# Patient Record
Sex: Female | Born: 2000 | Hispanic: No | Marital: Single | Smoking: Never smoker
Health system: Southern US, Community
[De-identification: ages and names within clinical notes are randomized; demographics above are authoritative.]

## PROBLEM LIST (undated history)

## (undated) DIAGNOSIS — F419 Anxiety disorder, unspecified: Secondary | ICD-10-CM

## (undated) DIAGNOSIS — J45909 Unspecified asthma, uncomplicated: Secondary | ICD-10-CM

## (undated) DIAGNOSIS — N946 Dysmenorrhea, unspecified: Secondary | ICD-10-CM

## (undated) DIAGNOSIS — R040 Epistaxis: Secondary | ICD-10-CM

## (undated) DIAGNOSIS — J309 Allergic rhinitis, unspecified: Secondary | ICD-10-CM

## (undated) DIAGNOSIS — F32A Depression, unspecified: Secondary | ICD-10-CM

## (undated) DIAGNOSIS — L709 Acne, unspecified: Secondary | ICD-10-CM

## (undated) HISTORY — DX: Acne, unspecified: L70.9

## (undated) HISTORY — PX: NO PAST SURGERIES: SHX2092

## (undated) HISTORY — DX: Dysmenorrhea, unspecified: N94.6

## (undated) HISTORY — DX: Epistaxis: R04.0

## (undated) HISTORY — DX: Anxiety disorder, unspecified: F41.9

## (undated) HISTORY — DX: Allergic rhinitis, unspecified: J30.9

## (undated) HISTORY — DX: Depression, unspecified: F32.A

## (undated) NOTE — *Deleted (*Deleted)
I value your feedback and entrusting us with your care. If you get a [facility] patient survey, I would appreciate you taking the time to let us know about your experience today. Thank you!  As of November 27, 2019, your lab results will be released to your MyChart immediately, before I even have a chance to see them. Please give me time to review them and contact you if there are any abnormalities. Thank you for your patience.  

---
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
## 2020-08-02 ENCOUNTER — Ambulatory Visit: Payer: Self-pay | Admitting: Obstetrics and Gynecology

## 2020-08-02 NOTE — Progress Notes (Deleted)
PCP:  Patient, No Pcp Per   No chief complaint on file.    HPI:      Danielle Hammond is a 19 y.o. G0P0000 whose LMP was No LMP recorded. (Menstrual status: Irregular Periods)., presents today for her annual examination.  Her menses are {norm/abn:715}, lasting {number:22536} days.  Dysmenorrhea {dysmen:716}. She {does:18564} have intermenstrual bleeding.  Sex activity: {sex active:315163}.  Last Pap: {PYPP:509326712}  Results were: {norm/abn:16707::"no abnormalities"} /neg HPV DNA *** Hx of STDs: {STD hx:14358}  Last mammogram: {date:304500300}  Results were: {norm/abn:13465} There is no FH of breast cancer. There is no FH of ovarian cancer. The patient {does:18564} do self-breast exams.  Tobacco use: {tob:20664} Alcohol use: {Alcohol:11675} No drug use.  Exercise: {exercise:31265}  She {does:18564} get adequate calcium and Vitamin D in her diet.    The pregnancy intention screening data noted above was reviewed. Potential methods of contraception were discussed. The patient elected to proceed with {Upstream End Methods:24109}.    Past Medical History:  Diagnosis Date  . Acne   . Allergic rhinitis   . Dysmenorrhea in adolescent   . Epistaxis     No past surgical history on file.  Family History  Problem Relation Age of Onset  . Breast cancer Neg Hx   . Ovarian cancer Neg Hx     Social History   Socioeconomic History  . Marital status: Single    Spouse name: Not on file  . Number of children: Not on file  . Years of education: Not on file  . Highest education level: Not on file  Occupational History  . Not on file  Tobacco Use  . Smoking status: Never Smoker  . Smokeless tobacco: Never Used  Vaping Use  . Vaping Use: Never used  Substance and Sexual Activity  . Alcohol use: Never  . Drug use: Never  . Sexual activity: Not Currently    Birth control/protection: None  Other Topics Concern  . Not on file  Social History Narrative  . Not on file     Social Determinants of Health   Financial Resource Strain:   . Difficulty of Paying Living Expenses:   Food Insecurity:   . Worried About Programme researcher, broadcasting/film/video in the Last Year:   . Barista in the Last Year:   Transportation Needs:   . Freight forwarder (Medical):   Marland Kitchen Lack of Transportation (Non-Medical):   Physical Activity:   . Days of Exercise per Week:   . Minutes of Exercise per Session:   Stress:   . Feeling of Stress :   Social Connections:   . Frequency of Communication with Friends and Family:   . Frequency of Social Gatherings with Friends and Family:   . Attends Religious Services:   . Active Member of Clubs or Organizations:   . Attends Banker Meetings:   Marland Kitchen Marital Status:   Intimate Partner Violence:   . Fear of Current or Ex-Partner:   . Emotionally Abused:   Marland Kitchen Physically Abused:   . Sexually Abused:      Current Outpatient Medications:  .  albuterol (PROVENTIL HFA;VENTOLIN HFA) 108 (90 BASE) MCG/ACT inhaler, Inhale 2 puffs into the lungs every 6 (six) hours as needed. For wheezing, Disp: , Rfl:  .  albuterol (PROVENTIL HFA;VENTOLIN HFA) 108 (90 BASE) MCG/ACT inhaler, Inhale 2 puffs into the lungs every 6 (six) hours as needed for wheezing or shortness of breath., Disp: 1 Inhaler, Rfl: 0 .  cetirizine (ZYRTEC) 10 MG tablet, Take 10 mg by mouth daily., Disp: , Rfl:  .  fluticasone (FLONASE) 50 MCG/ACT nasal spray, Place 1 spray into both nostrils daily., Disp: 16 g, Rfl: 0 .  montelukast (SINGULAIR) 5 MG chewable tablet, Chew 5 mg by mouth daily., Disp: , Rfl:  .  Norethindrone Acetate-Ethinyl Estrad-FE (MICROGESTIN 24 FE) 1-20 MG-MCG(24) tablet, Take 1 tablet by mouth daily., Disp: 84 tablet, Rfl: 3     ROS:  Review of Systems BREAST: No symptoms   Objective: There were no vitals taken for this visit.   OBGyn Exam  Results: No results found for this or any previous visit (from the past 24  hour(s)).  Assessment/Plan: No diagnosis found.  No orders of the defined types were placed in this encounter.            GYN counsel {counseling:16159}     F/U  No follow-ups on file.  Sailor Haughn B. Almeda Ezra, PA-C 08/02/2020 11:31 AM

## 2020-08-19 ENCOUNTER — Encounter: Payer: Medicaid Other | Admitting: Obstetrics and Gynecology

## 2020-08-25 ENCOUNTER — Encounter: Payer: Medicaid Other | Admitting: Obstetrics and Gynecology

## 2020-08-25 NOTE — Progress Notes (Deleted)
Patient, No Pcp Per   No chief complaint on file.   HPI:      [name] is a 19 y.o. G0P0000 whose LMP was No LMP recorded. (Menstrual status: Irregular Periods)., presents today for ***  Neg STD testing 11/19   Past Medical History:  Diagnosis Date  . Acne   . Allergic rhinitis   . Dysmenorrhea in adolescent   . Epistaxis     No past surgical history on file.  Family History  Problem Relation Age of Onset  . Breast cancer Neg Hx   . Ovarian cancer Neg Hx     Social History   Socioeconomic History  . Marital status: Single    Spouse name: Not on file  . Number of children: Not on file  . Years of education: Not on file  . Highest education level: Not on file  Occupational History  . Not on file  Tobacco Use  . Smoking status: Never Smoker  . Smokeless tobacco: Never Used  Vaping Use  . Vaping Use: Never used  Substance and Sexual Activity  . Alcohol use: Never  . Drug use: Never  . Sexual activity: Not Currently    Birth control/protection: None  Other Topics Concern  . Not on file  Social History Narrative  . Not on file   Social Determinants of Health   Financial Resource Strain:   . Difficulty of Paying Living Expenses: Not on file  Food Insecurity:   . Worried About Programme researcher, broadcasting/film/video in the Last Year: Not on file  . Ran Out of Food in the Last Year: Not on file  Transportation Needs:   . Lack of Transportation (Medical): Not on file  . Lack of Transportation (Non-Medical): Not on file  Physical Activity:   . Days of Exercise per Week: Not on file  . Minutes of Exercise per Session: Not on file  Stress:   . Feeling of Stress : Not on file  Social Connections:   . Frequency of Communication with Friends and Family: Not on file  . Frequency of Social Gatherings with Friends and Family: Not on file  . Attends Religious Services: Not on file  . Active Member of Clubs or Organizations: Not on file  . Attends Banker  Meetings: Not on file  . Marital Status: Not on file  Intimate Partner Violence:   . Fear of Current or Ex-Partner: Not on file  . Emotionally Abused: Not on file  . Physically Abused: Not on file  . Sexually Abused: Not on file    Outpatient Medications Prior to Visit  Medication Sig Dispense Refill  . albuterol (PROVENTIL HFA;VENTOLIN HFA) 108 (90 BASE) MCG/ACT inhaler Inhale 2 puffs into the lungs every 6 (six) hours as needed. For wheezing    . albuterol (PROVENTIL HFA;VENTOLIN HFA) 108 (90 BASE) MCG/ACT inhaler Inhale 2 puffs into the lungs every 6 (six) hours as needed for wheezing or shortness of breath. 1 Inhaler 0  . cetirizine (ZYRTEC) 10 MG tablet Take 10 mg by mouth daily.    . fluticasone (FLONASE) 50 MCG/ACT nasal spray Place 1 spray into both nostrils daily. 16 g 0  . montelukast (SINGULAIR) 5 MG chewable tablet Chew 5 mg by mouth daily.    . Norethindrone Acetate-Ethinyl Estrad-FE (MICROGESTIN 24 FE) 1-20 MG-MCG(24) tablet Take 1 tablet by mouth daily. 84 tablet 3   No facility-administered medications prior to visit.      ROS:  Review of Systems BREAST: No symptoms   OBJECTIVE:   Vitals:  There were no vitals taken for this visit.  Physical Exam  Results: No results found for this or any previous visit (from the past 24 hour(s)).   Assessment/Plan: No diagnosis found.    No orders of the defined types were placed in this encounter.     No follow-ups on file.  Harutyun Monteverde B. Aveleen Nevers, PA-C 08/25/2020 10:10 AM

## 2020-09-06 ENCOUNTER — Encounter: Payer: Self-pay | Admitting: Obstetrics and Gynecology

## 2020-09-06 ENCOUNTER — Ambulatory Visit (INDEPENDENT_AMBULATORY_CARE_PROVIDER_SITE_OTHER): Payer: Medicaid Other | Admitting: Obstetrics and Gynecology

## 2020-09-06 ENCOUNTER — Other Ambulatory Visit (HOSPITAL_COMMUNITY)
Admission: RE | Admit: 2020-09-06 | Discharge: 2020-09-06 | Disposition: A | Discharge status: Home / Self Care | Payer: Medicaid Other | Source: Ambulatory Visit | Attending: Obstetrics and Gynecology | Admitting: Obstetrics and Gynecology

## 2020-09-06 ENCOUNTER — Other Ambulatory Visit: Payer: Self-pay

## 2020-09-06 VITALS — BP 90/60 | Ht 64.0 in | Wt 133.0 lb

## 2020-09-06 DIAGNOSIS — A630 Anogenital (venereal) warts: Secondary | ICD-10-CM

## 2020-09-06 DIAGNOSIS — Z113 Encounter for screening for infections with a predominantly sexual mode of transmission: Secondary | ICD-10-CM | POA: Diagnosis present

## 2020-09-06 NOTE — Progress Notes (Signed)
Patient, No Pcp Per   Chief Complaint  Patient presents with  . STD testing    HPI:      [name] is a 19 y.o. G0P0000 whose LMP was Patient's last menstrual period was 08/11/2020 (approximate)., presents today for STD testing. No vag d/c, known exposures, but has noticed some bumps vaginally recently. She has not been sex active in a couple months and now has female partners.  Stopped OCPs about 2 yrs ago. Doing fine without them.    Past Medical History:  Diagnosis Date  . Acne   . Allergic rhinitis   . Dysmenorrhea in adolescent   . Epistaxis     History reviewed. No pertinent surgical history.  Family History  Problem Relation Age of Onset  . Breast cancer Neg Hx   . Ovarian cancer Neg Hx     Social History   Socioeconomic History  . Marital status: Single    Spouse name: Not on file  . Number of children: Not on file  . Years of education: Not on file  . Highest education level: Not on file  Occupational History  . Not on file  Tobacco Use  . Smoking status: Never Smoker  . Smokeless tobacco: Never Used  Vaping Use  . Vaping Use: Never used  Substance and Sexual Activity  . Alcohol use: Never  . Drug use: Never  . Sexual activity: Yes    Birth control/protection: None  Other Topics Concern  . Not on file  Social History Narrative  . Not on file   Social Determinants of Health   Financial Resource Strain:   . Difficulty of Paying Living Expenses: Not on file  Food Insecurity:   . Worried About Programme researcher, broadcasting/film/video in the Last Year: Not on file  . Ran Out of Food in the Last Year: Not on file  Transportation Needs:   . Lack of Transportation (Medical): Not on file  . Lack of Transportation (Non-Medical): Not on file  Physical Activity:   . Days of Exercise per Week: Not on file  . Minutes of Exercise per Session: Not on file  Stress:   . Feeling of Stress : Not on file  Social Connections:   . Frequency of Communication with  Friends and Family: Not on file  . Frequency of Social Gatherings with Friends and Family: Not on file  . Attends Religious Services: Not on file  . Active Member of Clubs or Organizations: Not on file  . Attends Banker Meetings: Not on file  . Marital Status: Not on file  Intimate Partner Violence:   . Fear of Current or Ex-Partner: Not on file  . Emotionally Abused: Not on file  . Physically Abused: Not on file  . Sexually Abused: Not on file    Outpatient Medications Prior to Visit  Medication Sig Dispense Refill  . fluticasone (FLONASE) 50 MCG/ACT nasal spray Place 1 spray into both nostrils daily. 16 g 0  . Norethindrone Acetate-Ethinyl Estrad-FE (MICROGESTIN 24 FE) 1-20 MG-MCG(24) tablet Take 1 tablet by mouth daily. (Patient not taking: Reported on 09/06/2020) 84 tablet 3  . albuterol (PROVENTIL HFA;VENTOLIN HFA) 108 (90 BASE) MCG/ACT inhaler Inhale 2 puffs into the lungs every 6 (six) hours as needed. For wheezing    . albuterol (PROVENTIL HFA;VENTOLIN HFA) 108 (90 BASE) MCG/ACT inhaler Inhale 2 puffs into the lungs every 6 (six) hours as needed for wheezing or shortness of breath. 1 Inhaler 0  .  cetirizine (ZYRTEC) 10 MG tablet Take 10 mg by mouth daily.    . montelukast (SINGULAIR) 5 MG chewable tablet Chew 5 mg by mouth daily.     No facility-administered medications prior to visit.      ROS:  Review of Systems  Constitutional: Negative for fever.  Gastrointestinal: Negative for blood in stool, constipation, diarrhea, nausea and vomiting.  Genitourinary: Negative for dyspareunia, dysuria, flank pain, frequency, hematuria, urgency, vaginal bleeding, vaginal discharge and vaginal pain.  Musculoskeletal: Negative for back pain.  Skin: Negative for rash.    OBJECTIVE:   Vitals:  BP 90/60   Ht 5\' 4"  (1.626 m)   Wt 133 lb (60.3 kg)   LMP 08/11/2020 (Approximate)   BMI 22.83 kg/m   Physical Exam Vitals reviewed.  Constitutional:      Appearance: She  is well-developed.  Pulmonary:     Effort: Pulmonary effort is normal.  Genitourinary:    Labia:        Right: No rash, tenderness or lesion.        Left: Lesion present. No rash or tenderness.     Musculoskeletal:        General: Normal range of motion.     Cervical back: Normal range of motion.  Skin:    General: Skin is warm and dry.  Neurological:     General: No focal deficit present.     Mental Status: She is alert and oriented to person, place, and time.     Cranial Nerves: No cranial nerve deficit.  Psychiatric:        Mood and Affect: Mood normal.        Behavior: Behavior normal.        Thought Content: Thought content normal.        Judgment: Judgment normal.     Assessment/Plan: Screening for STD (sexually transmitted disease) - Plan: Cervicovaginal ancillary only; will f/u with results  Genital warts--several ext lesions, treated with TCA. Wash in 4-6 hrs. F/u in 4-6 wks prn sx.     Return if symptoms worsen or fail to improve.  Gabreil [place]onkers B. Adael Culbreath, PA-C 09/06/2020 4:47 PM

## 2020-09-06 NOTE — Patient Instructions (Signed)
I value your feedback and entrusting us with your care. If you get a [facility] patient survey, I would appreciate you taking the time to let us know about your experience today. Thank you!  As of November 27, 2019, your lab results will be released to your MyChart immediately, before I even have a chance to see them. Please give me time to review them and contact you if there are any abnormalities. Thank you for your patience.  

## 2020-09-10 LAB — CERVICOVAGINAL ANCILLARY ONLY
Chlamydia: NEGATIVE
Comment: NEGATIVE
Comment: NEGATIVE
Comment: NORMAL
Neisseria Gonorrhea: NEGATIVE
Trichomonas: NEGATIVE

## 2020-10-21 ENCOUNTER — Ambulatory Visit: Payer: Medicaid Other | Admitting: Obstetrics and Gynecology

## 2020-10-21 NOTE — Progress Notes (Deleted)
    Patient, No Pcp Per   No chief complaint on file.   HPI:      Ms. Analilia Geddis is a 19 y.o. G0P0000 whose LMP was No LMP recorded., presents today for ***  Treated 9/21 with podophyllin for ext warts  Past Medical History:  Diagnosis Date  . Acne   . Allergic rhinitis   . Dysmenorrhea in adolescent   . Epistaxis     No past surgical history on file.  Family History  Problem Relation Age of Onset  . Breast cancer Neg Hx   . Ovarian cancer Neg Hx     Social History   Socioeconomic History  . Marital status: Single    Spouse name: Not on file  . Number of children: Not on file  . Years of education: Not on file  . Highest education level: Not on file  Occupational History  . Not on file  Tobacco Use  . Smoking status: Never Smoker  . Smokeless tobacco: Never Used  Vaping Use  . Vaping Use: Never used  Substance and Sexual Activity  . Alcohol use: Never  . Drug use: Never  . Sexual activity: Yes    Birth control/protection: None  Other Topics Concern  . Not on file  Social History Narrative  . Not on file   Social Determinants of Health   Financial Resource Strain:   . Difficulty of Paying Living Expenses: Not on file  Food Insecurity:   . Worried About Programme researcher, broadcasting/film/video in the Last Year: Not on file  . Ran Out of Food in the Last Year: Not on file  Transportation Needs:   . Lack of Transportation (Medical): Not on file  . Lack of Transportation (Non-Medical): Not on file  Physical Activity:   . Days of Exercise per Week: Not on file  . Minutes of Exercise per Session: Not on file  Stress:   . Feeling of Stress : Not on file  Social Connections:   . Frequency of Communication with Friends and Family: Not on file  . Frequency of Social Gatherings with Friends and Family: Not on file  . Attends Religious Services: Not on file  . Active Member of Clubs or Organizations: Not on file  . Attends Banker Meetings: Not on file  .  Marital Status: Not on file  Intimate Partner Violence:   . Fear of Current or Ex-Partner: Not on file  . Emotionally Abused: Not on file  . Physically Abused: Not on file  . Sexually Abused: Not on file    Outpatient Medications Prior to Visit  Medication Sig Dispense Refill  . fluticasone (FLONASE) 50 MCG/ACT nasal spray Place 1 spray into both nostrils daily. 16 g 0  . Norethindrone Acetate-Ethinyl Estrad-FE (MICROGESTIN 24 FE) 1-20 MG-MCG(24) tablet Take 1 tablet by mouth daily. (Patient not taking: Reported on 09/06/2020) 84 tablet 3   No facility-administered medications prior to visit.      ROS:  Review of Systems BREAST: No symptoms   OBJECTIVE:   Vitals:  There were no vitals taken for this visit.  Physical Exam  Results: No results found for this or any previous visit (from the past 24 hour(s)).   Assessment/Plan: No diagnosis found.    No orders of the defined types were placed in this encounter.     No follow-ups on file.  Nickalus Thornsberry B. Issai Werling, PA-C 10/21/2020 11:46 AM

## 2020-10-28 ENCOUNTER — Ambulatory Visit: Payer: Medicaid Other | Admitting: Obstetrics and Gynecology

## 2020-11-30 ENCOUNTER — Ambulatory Visit (INDEPENDENT_AMBULATORY_CARE_PROVIDER_SITE_OTHER): Payer: Medicaid Other | Admitting: Obstetrics and Gynecology

## 2020-11-30 ENCOUNTER — Other Ambulatory Visit: Payer: Self-pay

## 2020-11-30 ENCOUNTER — Encounter: Payer: Self-pay | Admitting: Obstetrics and Gynecology

## 2020-11-30 ENCOUNTER — Other Ambulatory Visit (HOSPITAL_COMMUNITY)
Admission: RE | Admit: 2020-11-30 | Discharge: 2020-11-30 | Disposition: A | Discharge status: Home / Self Care | Payer: Medicaid Other | Source: Ambulatory Visit | Attending: Obstetrics and Gynecology | Admitting: Obstetrics and Gynecology

## 2020-11-30 VITALS — BP 100/70 | Ht 65.0 in | Wt 128.0 lb

## 2020-11-30 DIAGNOSIS — A630 Anogenital (venereal) warts: Secondary | ICD-10-CM | POA: Diagnosis not present

## 2020-11-30 DIAGNOSIS — N760 Acute vaginitis: Secondary | ICD-10-CM

## 2020-11-30 DIAGNOSIS — Z3009 Encounter for other general counseling and advice on contraception: Secondary | ICD-10-CM

## 2020-11-30 DIAGNOSIS — Z113 Encounter for screening for infections with a predominantly sexual mode of transmission: Secondary | ICD-10-CM

## 2020-11-30 DIAGNOSIS — B9689 Other specified bacterial agents as the cause of diseases classified elsewhere: Secondary | ICD-10-CM | POA: Diagnosis not present

## 2020-11-30 LAB — POCT WET PREP WITH KOH
Clue Cells Wet Prep HPF POC: POSITIVE
KOH Prep POC: POSITIVE — AB
Trichomonas, UA: NEGATIVE
Yeast Wet Prep HPF POC: NEGATIVE

## 2020-11-30 MED ORDER — METRONIDAZOLE 500 MG PO TABS: 500.0000 mg | ORAL_TABLET | Freq: Two times a day (BID) | ORAL | 0 refills | Status: AC

## 2020-11-30 NOTE — Progress Notes (Signed)
Patient, No Pcp Per   Chief Complaint  Patient presents with  . STD testing    HPI:      Ms. Wilma Michaelson is a 19 y.o. G0P0000 whose LMP was Patient's last menstrual period was 11/02/2020 (approximate)., presents today for increased vag d/c with odor, no irritation. Sx off and on since 10/21 but increased past 2 wks. No hx of BV. Has new female sex partner since neg 9/21 STD testing. Using condoms sometimes.  Treated for ext vag warts 9/21 with TCA with mostly relief. Has 2 small remaining lesions and would like retreated.  Menses monthly. Had clots with mid cycle bleeding 11/21, concerned about SAB. Wasn't late for period, no UPT done. Used to be on OCPs, declines BC for now. Sometimes using condoms.    Past Medical History:  Diagnosis Date  . Acne   . Allergic rhinitis   . Dysmenorrhea in adolescent   . Epistaxis     History reviewed. No pertinent surgical history.  Family History  Problem Relation Age of Onset  . Breast cancer Neg Hx   . Ovarian cancer Neg Hx     Social History   Socioeconomic History  . Marital status: Single    Spouse name: Not on file  . Number of children: Not on file  . Years of education: Not on file  . Highest education level: Not on file  Occupational History  . Not on file  Tobacco Use  . Smoking status: Never Smoker  . Smokeless tobacco: Never Used  Vaping Use  . Vaping Use: Never used  Substance and Sexual Activity  . Alcohol use: Never  . Drug use: Never  . Sexual activity: Yes    Birth control/protection: None  Other Topics Concern  . Not on file  Social History Narrative  . Not on file   Social Determinants of Health   Financial Resource Strain: Not on file  Food Insecurity: Not on file  Transportation Needs: Not on file  Physical Activity: Not on file  Stress: Not on file  Social Connections: Not on file  Intimate Partner Violence: Not on file    Outpatient Medications Prior to Visit  Medication Sig Dispense  Refill  . fluticasone (FLONASE) 50 MCG/ACT nasal spray Place 1 spray into both nostrils daily. (Patient not taking: Reported on 11/30/2020) 16 g 0  . Norethindrone Acetate-Ethinyl Estrad-FE (MICROGESTIN 24 FE) 1-20 MG-MCG(24) tablet Take 1 tablet by mouth daily. (Patient not taking: No sig reported) 84 tablet 3   No facility-administered medications prior to visit.      ROS:  Review of Systems  Constitutional: Negative for fever, malaise/fatigue and weight loss.  Gastrointestinal: Negative for blood in stool, constipation, diarrhea, nausea and vomiting.  Genitourinary: Positive for vaginal discharge. Negative for dyspareunia, dysuria, flank pain, frequency, hematuria, urgency, vaginal bleeding and vaginal pain.  Musculoskeletal: Negative for back pain.  Skin: Negative for itching and rash.    OBJECTIVE:   Vitals:  BP 100/70   Ht 5\' 5"  (1.651 m)   Wt 128 lb (58.1 kg)   LMP 11/02/2020 (Approximate)   BMI 21.30 kg/m   Physical Exam Vitals reviewed.  Constitutional:      Appearance: She is well-developed.  Pulmonary:     Effort: Pulmonary effort is normal.  Genitourinary:    Pubic Area: No rash.      Labia:        Right: Lesion present. No rash or tenderness.  Left: No rash, tenderness or lesion.      Vagina: Vaginal discharge present. No erythema or tenderness.     Cervix: Normal.     Uterus: Normal. Not enlarged and not tender.      Adnexa: Right adnexa normal and left adnexa normal.       Right: No mass or tenderness.         Left: No mass or tenderness.         Comments: 2 SMALL EXT WARTS, 1 LABIA MINORA AND 1 POST FOURCHETTE; TREATED WITH PODOPHYLLIN Musculoskeletal:        General: Normal range of motion.     Cervical back: Normal range of motion.  Skin:    General: Skin is warm and dry.  Neurological:     General: No focal deficit present.     Mental Status: She is alert and oriented to person, place, and time.  Psychiatric:        Mood and Affect:  Mood normal.        Behavior: Behavior normal.        Thought Content: Thought content normal.        Judgment: Judgment normal.     Results: Results for orders placed or performed in visit on 11/30/20 (from the past 24 hour(s))  POCT Wet Prep with KOH     Status: Abnormal   Collection Time: 11/30/20  2:36 PM  Result Value Ref Range   Trichomonas, UA Negative    Clue Cells Wet Prep HPF POC pos    Epithelial Wet Prep HPF POC     Yeast Wet Prep HPF POC neg    Bacteria Wet Prep HPF POC     RBC Wet Prep HPF POC     WBC Wet Prep HPF POC     KOH Prep POC Positive (A) Negative     Assessment/Plan: Bacterial vaginosis - Plan: metroNIDAZOLE (FLAGYL) 500 MG tablet, POCT Wet Prep with KOH; pos sx and wet prep. Rx flagyl, no EtOH. Will RF if sx recur. Condoms.  F/u prn.   Screening for STD (sexually transmitted disease) - Plan: Cervicovaginal ancillary only  Genital warts--2 lesions treated with podophyllin, wash in 4-6 hrs. F/u prn.   Encounter for other general counseling or advice on contraception--offered BC but pt declines. Condoms.     Meds ordered this encounter  Medications  . metroNIDAZOLE (FLAGYL) 500 MG tablet    Sig: Take 1 tablet (500 mg total) by mouth 2 (two) times daily for 7 days.    Dispense:  14 tablet    Refill:  0    Order Specific Question:   Supervising Provider    Answer:   Nadara Mustard [[id number]]      Return if symptoms worsen or fail to improve.  Jayleana Colberg B. Favian Kittleson, PA-C 11/30/2020 2:42 PM

## 2020-11-30 NOTE — Patient Instructions (Signed)
I value your feedback and entrusting us with your care. If you get a [facility] patient survey, I would appreciate you taking the time to let us know about your experience today. Thank you!  As of November 27, 2019, your lab results will be released to your MyChart immediately, before I even have a chance to see them. Please give me time to review them and contact you if there are any abnormalities. Thank you for your patience.  

## 2020-12-02 LAB — CERVICOVAGINAL ANCILLARY ONLY
Chlamydia: NEGATIVE
Comment: NEGATIVE
Comment: NORMAL
Neisseria Gonorrhea: NEGATIVE

## 2021-05-20 ENCOUNTER — Encounter: Payer: Self-pay | Admitting: Obstetrics and Gynecology

## 2021-05-20 ENCOUNTER — Other Ambulatory Visit: Payer: Self-pay

## 2021-05-20 ENCOUNTER — Other Ambulatory Visit (HOSPITAL_COMMUNITY)
Admission: RE | Admit: 2021-05-20 | Discharge: 2021-05-20 | Disposition: A | Discharge status: Home / Self Care | Payer: Medicaid Other | Source: Ambulatory Visit | Attending: Obstetrics and Gynecology | Admitting: Obstetrics and Gynecology

## 2021-05-20 ENCOUNTER — Ambulatory Visit (INDEPENDENT_AMBULATORY_CARE_PROVIDER_SITE_OTHER): Payer: Medicaid Other | Admitting: Obstetrics and Gynecology

## 2021-05-20 VITALS — BP 96/70 | Ht 64.0 in | Wt 126.0 lb

## 2021-05-20 DIAGNOSIS — Z113 Encounter for screening for infections with a predominantly sexual mode of transmission: Secondary | ICD-10-CM

## 2021-05-20 DIAGNOSIS — J45990 Exercise induced bronchospasm: Secondary | ICD-10-CM

## 2021-05-20 DIAGNOSIS — A749 Chlamydial infection, unspecified: Secondary | ICD-10-CM

## 2021-05-20 DIAGNOSIS — Z30011 Encounter for initial prescription of contraceptive pills: Secondary | ICD-10-CM

## 2021-05-20 DIAGNOSIS — Z01419 Encounter for gynecological examination (general) (routine) without abnormal findings: Secondary | ICD-10-CM | POA: Diagnosis not present

## 2021-05-20 DIAGNOSIS — J339 Nasal polyp, unspecified: Secondary | ICD-10-CM

## 2021-05-20 DIAGNOSIS — N898 Other specified noninflammatory disorders of vagina: Secondary | ICD-10-CM | POA: Diagnosis not present

## 2021-05-20 DIAGNOSIS — N926 Irregular menstruation, unspecified: Secondary | ICD-10-CM | POA: Diagnosis not present

## 2021-05-20 LAB — POCT WET PREP WITH KOH
Clue Cells Wet Prep HPF POC: NEGATIVE
KOH Prep POC: NEGATIVE
Trichomonas, UA: NEGATIVE
Yeast Wet Prep HPF POC: NEGATIVE

## 2021-05-20 MED ORDER — DROSPIRENONE-ETHINYL ESTRADIOL 3-0.02 MG PO TABS: 1.0000 | ORAL_TABLET | Freq: Every day | ORAL | 3 refills | Status: DC

## 2021-05-20 MED ORDER — FLUTICASONE PROPIONATE 50 MCG/ACT NA SUSP: 2.0000 | Freq: Every day | NASAL | 3 refills | Status: DC

## 2021-05-20 MED ORDER — ALBUTEROL SULFATE HFA 108 (90 BASE) MCG/ACT IN AERS: 2.0000 | INHALATION_SPRAY | Freq: Four times a day (QID) | RESPIRATORY_TRACT | 2 refills | Status: DC | PRN

## 2021-05-20 NOTE — Patient Instructions (Signed)
I value your feedback and you entrusting us with your care. If you get a [facility] patient survey, I would appreciate you taking the time to let us know about your experience today. Thank you! ? ? ?

## 2021-05-20 NOTE — Progress Notes (Signed)
PCP:  Patient, No Pcp Per (Inactive)   Chief Complaint  Patient presents with  . Gynecologic Exam    Has skipped a cycle twice within the last few months, neg UPTs  . Contraception    Discuss OCP's     HPI:      [name] is a 20 y.o. G0P0000 whose LMP was Patient's last menstrual period was 04/06/2021 (approximate). With spotting only 04/19/21, presents today for her annual examination.  Her menses are usually regular every 28-30 days, lasting 5-6 days, mod flow.  Dysmenorrhea mild, occurring first 1-2 days of flow. She does not have intermenstrual bleeding. Had normal menses 4/22, then spotting for a few days 2 wks later, and no menses since; several neg UPTs. Has been under increased stress.   Sex activity: single partner, contraception - none. Would like to restart OCPs. No hx of HTN, DVTs, migraines with aura. Did in past with good cycle control, improved dysmen. Last Pap: N/A due to age Hx of STDs: ext HPV, treated with TCA in past, last tx with podophyllin 12/21  Hx of BV in past, treated with flagyl with sx relief. Having increased d/c with odor again for past month. No meds to treat. Uses scented soap and dryer sheets.   There is no FH of breast cancer. There is no FH of ovarian cancer. The patient does do self-breast exams.  Tobacco use: The patient denies current or previous tobacco use. Alcohol use: none No drug use.  Exercise: moderately active  She does get adequate calcium but not Vitamin D in her diet. 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 Not usually bothersome but is now dancing and has sx with exercise. No sx otherwise.  Needs Rx RF flonase for nasal polyp/allergies.   Past Medical History:  Diagnosis Date  . Acne   . Allergic rhinitis   . Dysmenorrhea in adolescent   . Epistaxis     History reviewed. No pertinent surgical history.  Family History  Problem Relation Age of Onset  . Breast cancer Neg Hx   . Ovarian cancer Neg Hx      Social History   Socioeconomic History  . Marital status: Single    Spouse name: Not on file  . Number of children: Not on file  . Years of education: Not on file  . Highest education level: Not on file  Occupational History  . Not on file  Tobacco Use  . Smoking status: Never Smoker  . Smokeless tobacco: Never Used  Vaping Use  . Vaping Use: Never used  Substance and Sexual Activity  . Alcohol use: Never  . Drug use: Never  . Sexual activity: Yes    Birth control/protection: None  Other Topics Concern  . Not on file  Social History Narrative  . Not on file   Social Determinants of Health   Financial Resource Strain: Not on file  Food Insecurity: Not on file  Transportation Needs: Not on file  Physical Activity: Not on file  Stress: Not on file  Social Connections: Not on file  Intimate Partner Violence: Not on file     Current Outpatient Medications:  .  drospirenone-ethinyl estradiol (YAZ) 3-0.02 MG tablet, Take 1 tablet by mouth daily., Disp: 84 tablet, Rfl: 3 .  albuterol (VENTOLIN HFA) 108 (90 Base) MCG/ACT inhaler, Inhale 2 puffs into the lungs every 6 (six) hours as needed. For wheezing, Disp: 1 each, Rfl: 2 .  fluticasone (FLONASE) 50 MCG/ACT nasal spray,  Place 2 sprays into both nostrils daily., Disp: 16 g, Rfl: 3     ROS:  Review of Systems  Constitutional: Negative for fatigue, fever and unexpected weight change.  Respiratory: Negative for cough, shortness of breath and wheezing.   Cardiovascular: Negative for chest pain, palpitations and leg swelling.  Gastrointestinal: Negative for blood in stool, constipation, diarrhea, nausea and vomiting.  Endocrine: Negative for cold intolerance, heat intolerance and polyuria.  Genitourinary: Positive for menstrual problem and vaginal discharge. Negative for dyspareunia, dysuria, flank pain, frequency, genital sores, hematuria, pelvic pain, urgency, vaginal bleeding and vaginal pain.  Musculoskeletal:  Positive for arthralgias. Negative for back pain, joint swelling and myalgias.  Skin: Negative for rash.  Neurological: Positive for headaches. Negative for dizziness, syncope, light-headedness and numbness.  Hematological: Negative for adenopathy.  Psychiatric/Behavioral: Positive for agitation and dysphoric mood. Negative for confusion, sleep disturbance and suicidal ideas. The patient is not nervous/anxious.   BREAST: No symptoms   Objective: BP 96/70   Ht 5\' 4"  (1.626 m)   Wt 126 lb (57.2 kg)   LMP 04/19/2021 (Approximate)   BMI 21.63 kg/m    Physical Exam Constitutional:      Appearance: She is well-developed.  Genitourinary:     Vulva normal.     Right Labia: No rash, tenderness or lesions.    Left Labia: No tenderness, lesions or rash.    No vaginal discharge, erythema or tenderness.      Right Adnexa: not tender and no mass present.    Left Adnexa: not tender and no mass present.    No cervical friability or polyp.     Uterus is not enlarged or tender.  Breasts:     Right: No mass, nipple discharge, skin change or tenderness.     Left: No mass, nipple discharge, skin change or tenderness.    Neck:     Thyroid: No thyromegaly.  Cardiovascular:     Rate and Rhythm: Normal rate and regular rhythm.     Heart sounds: Normal heart sounds. No murmur heard.   Pulmonary:     Effort: Pulmonary effort is normal.     Breath sounds: Normal breath sounds.  Abdominal:     Palpations: Abdomen is soft.     Tenderness: There is no abdominal tenderness. There is no guarding or rebound.  Musculoskeletal:        General: Normal range of motion.     Cervical back: Normal range of motion.  Lymphadenopathy:     Cervical: No cervical adenopathy.  Neurological:     General: No focal deficit present.     Mental Status: She is alert and oriented to person, place, and time.     Cranial Nerves: No cranial nerve deficit.  Skin:    General: Skin is warm and dry.  Psychiatric:         Mood and Affect: Mood normal.        Behavior: Behavior normal.        Thought Content: Thought content normal.        Judgment: Judgment normal.  Vitals reviewed.     Results: Results for orders placed or performed in visit on 05/20/21 (from the past 24 hour(s))  POCT Wet Prep with KOH     Status: Normal   Collection Time: 05/20/21  3:10 PM  Result Value Ref Range   Trichomonas, UA Negative    Clue Cells Wet Prep HPF POC neg    Epithelial Wet Prep HPF POC  Yeast Wet Prep HPF POC neg    Bacteria Wet Prep HPF POC     RBC Wet Prep HPF POC     WBC Wet Prep HPF POC     KOH Prep POC Negative Negative    Assessment/Plan: Encounter for annual routine gynecological examination  Screening for STD (sexually transmitted disease) - Plan: Cervicovaginal ancillary only,   Encounter for initial prescription of contraceptive pills - Plan: drospirenone-ethinyl estradiol (YAZ) 3-0.02 MG tablet; BC options discussed. OCP start with next menses, condoms. Rx eRxd. F/u prn.   Irregular menses--since 4/22 with neg UPTs. See if menses starts ~06/06/21. If not, will do provera challenge after neg UPT. Can then start OCPs with withdrawal bleed.   Vaginal discharge - Plan: POCT Wet Prep with KOH, Cervicovaginal ancillary only; neg wet prep, check STDs/swab. Will f/u if abn. If neg, reassurance. Dove sens skin soap, no dryer sheets, condoms.   Exercise-induced asthma - Plan: albuterol (VENTOLIN HFA) 108 (90 Base) MCG/ACT inhaler; Rx RF.   Nasal polyp - Plan: fluticasone (FLONASE) 50 MCG/ACT nasal spray; Rx RF. Can also get OTC.   Meds ordered this encounter  Medications  . albuterol (VENTOLIN HFA) 108 (90 Base) MCG/ACT inhaler    Sig: Inhale 2 puffs into the lungs every 6 (six) hours as needed. For wheezing    Dispense:  1 each    Refill:  2    Order Specific Question:   Supervising Provider    Answer:   Nadara Mustard [id number]  . fluticasone (FLONASE) 50 MCG/ACT nasal spray    Sig: Place 2  sprays into both nostrils daily.    Dispense:  16 g    Refill:  3    Order Specific Question:   Supervising Provider    Answer:   Nadara Mustard [id number]  . drospirenone-ethinyl estradiol (YAZ) 3-0.02 MG tablet    Sig: Take 1 tablet by mouth daily.    Dispense:  84 tablet    Refill:  3    Order Specific Question:   Supervising Provider    Answer:   Nadara Mustard [[id number]]             GYN counsel STD prevention, family planning choices, adequate intake of calcium and vitamin D, diet and exercise     F/U  Return in about 1 year (around 05/20/2022).  Danielle Hammond B. Danielle Brissette, PA-C 05/20/2021 3:13 PM

## 2021-05-24 LAB — CERVICOVAGINAL ANCILLARY ONLY
Bacterial Vaginitis (gardnerella): NEGATIVE
Chlamydia: POSITIVE — AB
Comment: NEGATIVE
Comment: NEGATIVE
Comment: NEGATIVE
Comment: NORMAL
Neisseria Gonorrhea: NEGATIVE
Trichomonas: NEGATIVE

## 2021-05-24 MED ORDER — AZITHROMYCIN 500 MG PO TABS: 1000.0000 mg | ORAL_TABLET | Freq: Once | ORAL | 0 refills | Status: AC

## 2021-05-24 NOTE — Addendum Note (Signed)
Addended by: Althea Grimmer B on: 05/24/2021 01:44 PM   Modules accepted: Orders

## 2021-05-24 NOTE — Progress Notes (Signed)
[facility] notified. 

## 2021-06-13 ENCOUNTER — Ambulatory Visit (INDEPENDENT_AMBULATORY_CARE_PROVIDER_SITE_OTHER): Payer: Medicaid Other | Admitting: Obstetrics and Gynecology

## 2021-06-13 ENCOUNTER — Other Ambulatory Visit: Payer: Self-pay

## 2021-06-13 ENCOUNTER — Encounter: Payer: Self-pay | Admitting: Obstetrics and Gynecology

## 2021-06-13 ENCOUNTER — Other Ambulatory Visit (HOSPITAL_COMMUNITY)
Admission: RE | Admit: 2021-06-13 | Discharge: 2021-06-13 | Disposition: A | Discharge status: Home / Self Care | Payer: Medicaid Other | Source: Ambulatory Visit | Attending: Obstetrics and Gynecology | Admitting: Obstetrics and Gynecology

## 2021-06-13 VITALS — BP 100/60 | Ht 64.0 in | Wt 130.0 lb

## 2021-06-13 DIAGNOSIS — A749 Chlamydial infection, unspecified: Secondary | ICD-10-CM | POA: Diagnosis present

## 2021-06-13 DIAGNOSIS — Z113 Encounter for screening for infections with a predominantly sexual mode of transmission: Secondary | ICD-10-CM | POA: Insufficient documentation

## 2021-06-13 NOTE — Progress Notes (Signed)
Patient, No Pcp Per (Inactive)   Chief Complaint  Patient presents with   STD testing    HPI:      [name] is a 20 y.o. G0P0000 whose LMP was Patient's last menstrual period was 06/13/2021 (exact date)., presents today for chlamydia test of cure from 6/22. Treated with azithro but pt had diarrhea/upset stomach. She thought her partner did tx but found his unopened bottle of abx. Pt concerned she is still positive. No longer plans to be with him. Has increased vag d/c and a little burning now.  Had neg urine and serum pregnancy tests recently and started her period today. Had missed period since 4/22. Plans to start OCPs with this menses.    Past Medical History:  Diagnosis Date   Acne    Allergic rhinitis    Dysmenorrhea in adolescent    Epistaxis     History reviewed. No pertinent surgical history.  Family History  Problem Relation Age of Onset   Breast cancer Neg Hx    Ovarian cancer Neg Hx     Social History   Socioeconomic History   Marital status: Single    Spouse name: Not on file   Number of children: Not on file   Years of education: Not on file   Highest education level: Not on file  Occupational History   Not on file  Tobacco Use   Smoking status: Never   Smokeless tobacco: Never  Vaping Use   Vaping Use: Never used  Substance and Sexual Activity   Alcohol use: Never   Drug use: Never   Sexual activity: Yes    Birth control/protection: None  Other Topics Concern   Not on file  Social History Narrative   Not on file   Social Determinants of Health   Financial Resource Strain: Not on file  Food Insecurity: Not on file  Transportation Needs: Not on file  Physical Activity: Not on file  Stress: Not on file  Social Connections: Not on file  Intimate Partner Violence: Not on file    Outpatient Medications Prior to Visit  Medication Sig Dispense Refill   albuterol (VENTOLIN HFA) 108 (90 Base) MCG/ACT inhaler Inhale 2 puffs into  the lungs every 6 (six) hours as needed. For wheezing 1 each 2   fluticasone (FLONASE) 50 MCG/ACT nasal spray Place 2 sprays into both nostrils daily. 16 g 3   drospirenone-ethinyl estradiol (YAZ) 3-0.02 MG tablet Take 1 tablet by mouth daily. (Patient not taking: Reported on 06/13/2021) 84 tablet 3   No facility-administered medications prior to visit.    ROS:  Review of Systems  Constitutional:  Negative for fever.  Gastrointestinal:  Negative for blood in stool, constipation, diarrhea, nausea and vomiting.  Genitourinary:  Positive for vaginal discharge. Negative for dyspareunia, dysuria, flank pain, frequency, hematuria, urgency, vaginal bleeding and vaginal pain.  Musculoskeletal:  Negative for back pain.  Skin:  Negative for rash.  BREAST: No symptoms   OBJECTIVE:   Vitals:  BP 100/60   Ht 5\' 4"  (1.626 m)   Wt 130 lb (59 kg)   LMP 06/13/2021 (Exact Date)   BMI 22.31 kg/m   Physical Exam Vitals reviewed.  Constitutional:      Appearance: She is well-developed.  Pulmonary:     Effort: Pulmonary effort is normal.  Genitourinary:    General: Normal vulva.     Pubic Area: No rash.      Labia:  Right: No rash, tenderness or lesion.        Left: No rash, tenderness or lesion.      Vagina: Bleeding present. No vaginal discharge, erythema or tenderness.     Cervix: Normal.     Uterus: Normal. Not enlarged and not tender.      Adnexa: Right adnexa normal and left adnexa normal.       Right: No mass or tenderness.         Left: No mass or tenderness.    Musculoskeletal:        General: Normal range of motion.     Cervical back: Normal range of motion.  Skin:    General: Skin is warm and dry.  Neurological:     General: No focal deficit present.     Mental Status: She is alert and oriented to person, place, and time.  Psychiatric:        Mood and Affect: Mood normal.        Behavior: Behavior normal.        Thought Content: Thought content normal.         Judgment: Judgment normal.    Assessment/Plan: Chlamydia - Plan: Cervicovaginal ancillary only; TOC today. If pos, will treat with doxycycline given GI sx with azithro.   Screening for STD (sexually transmitted disease) - Plan: Cervicovaginal ancillary only  OCP start Sun with menses, condoms for 1 mo.     Return if symptoms worsen or fail to improve.  Danielle Hollett B. Matika Bartell, PA-C 06/13/2021 3:46 PM

## 2021-06-15 LAB — CERVICOVAGINAL ANCILLARY ONLY
Chlamydia: NEGATIVE
Comment: NEGATIVE
Comment: NORMAL
Neisseria Gonorrhea: NEGATIVE

## 2021-07-19 NOTE — Progress Notes (Signed)
Patient, No Pcp Per (Inactive)   Chief Complaint  Patient presents with   STD testing    HPI:      [name] is a 20 y.o. G0P0000 whose LMP was Patient's last menstrual period was 06/13/2021 (exact date)., presents today for STD testing. Had chlamydia 6/22 with azithro tx and neg TOC 3 wks later. Has had a new sex partner since then, but used condoms. Had yeast vag sx of vaginal irritation with thick white d/c, treated with AZO yeast and monistat-1 with sx relief 1-2 wks ago. Still having d/c, mild irritation, and mild odor.  Pt has had 3 positive UPTs since last visit and has NOB appt 08/18/21. Pt was late for menses 6/22 with neg UPT and neg serum beta 06/10/21. Menses started 06/13/21 and was light. Pt started OCPs with period and was only sex active once with a condom. Developed breast tenderness a few wks later and took UPT that was positive. She then took several more that were also positive. Stopped OCPs, no bleeding since. Hasn't started PNVs.   Past Medical History:  Diagnosis Date   Acne    Allergic rhinitis    Dysmenorrhea in adolescent    Epistaxis     History reviewed. No pertinent surgical history.  Family History  Problem Relation Age of Onset   Breast cancer Neg Hx    Ovarian cancer Neg Hx     Social History   Socioeconomic History   Marital status: Single    Spouse name: Not on file   Number of children: Not on file   Years of education: Not on file   Highest education level: Not on file  Occupational History   Not on file  Tobacco Use   Smoking status: Never   Smokeless tobacco: Never  Vaping Use   Vaping Use: Never used  Substance and Sexual Activity   Alcohol use: Never   Drug use: Never   Sexual activity: Yes    Birth control/protection: None  Other Topics Concern   Not on file  Social History Narrative   Not on file   Social Determinants of Health   Financial Resource Strain: Not on file  Food Insecurity: Not on file   Transportation Needs: Not on file  Physical Activity: Not on file  Stress: Not on file  Social Connections: Not on file  Intimate Partner Violence: Not on file    Outpatient Medications Prior to Visit  Medication Sig Dispense Refill   albuterol (VENTOLIN HFA) 108 (90 Base) MCG/ACT inhaler Inhale 2 puffs into the lungs every 6 (six) hours as needed. For wheezing 1 each 2   fluticasone (FLONASE) 50 MCG/ACT nasal spray Place 2 sprays into both nostrils daily. 16 g 3   drospirenone-ethinyl estradiol (YAZ) 3-0.02 MG tablet Take 1 tablet by mouth daily. (Patient not taking: No sig reported) 84 tablet 3   No facility-administered medications prior to visit.    ROS:  Review of Systems  Constitutional:  Negative for fever.  Gastrointestinal:  Negative for blood in stool, constipation, diarrhea, nausea and vomiting.  Genitourinary:  Positive for menstrual problem and vaginal discharge. Negative for dyspareunia, dysuria, flank pain, frequency, hematuria, urgency, vaginal bleeding and vaginal pain.  Musculoskeletal:  Negative for back pain.  Skin:  Negative for rash.  BREAST: No symptoms   OBJECTIVE:   Vitals:  BP 110/60   Ht 5\' 5"  (1.651 m)   Wt 131 lb (59.4 kg)   LMP 06/13/2021 (  Exact Date)   BMI 21.80 kg/m   Physical Exam Vitals reviewed.  Constitutional:      Appearance: She is well-developed.  Pulmonary:     Effort: Pulmonary effort is normal.  Genitourinary:    General: Normal vulva.     Pubic Area: No rash.      Labia:        Right: No rash, tenderness or lesion.        Left: No rash, tenderness or lesion.      Vagina: Vaginal discharge present. No erythema or tenderness.     Cervix: Normal.     Uterus: Normal. Not enlarged and not tender.      Adnexa: Right adnexa normal and left adnexa normal.       Right: No mass or tenderness.         Left: No mass or tenderness.       Comments: LOTS OF WHITE CREAMY D/C Musculoskeletal:        General: Normal range of motion.      Cervical back: Normal range of motion.  Skin:    General: Skin is warm and dry.  Neurological:     General: No focal deficit present.     Mental Status: She is alert and oriented to person, place, and time.  Psychiatric:        Mood and Affect: Mood normal.        Behavior: Behavior normal.        Thought Content: Thought content normal.        Judgment: Judgment normal.    Results: Results for orders placed or performed in visit on 07/20/21 (from the past 24 hour(s))  POCT Wet Prep with KOH     Status: Normal   Collection Time: 07/20/21 12:00 PM  Result Value Ref Range   Trichomonas, UA Negative    Clue Cells Wet Prep HPF POC NEG    Epithelial Wet Prep HPF POC     Yeast Wet Prep HPF POC NEG    Bacteria Wet Prep HPF POC     RBC Wet Prep HPF POC     WBC Wet Prep HPF POC     KOH Prep POC Negative Negative     Assessment/Plan: Vaginal discharge - Plan: Cervicovaginal ancillary only, POCT Wet Prep with KOH; pos exam and sx, neg wet prep. Rule out STD/check vag culture. Will f/u if positive.   Screening for STD (sexually transmitted disease) - Plan: Cervicovaginal ancillary only  Positive pregnancy test - Plan: Beta hCG quant (ref lab); pt with pos home UPT but started OCPs with last menses (neg serum beta HcG 3 days before LMP) and used condoms. Check serum beta. Will f/u with results. Start PNVs, has NOB appt.     Return if symptoms worsen or fail to improve.  Rhylen Pulido B. Aceyn Kathol, PA-C 07/20/2021 12:02 PM

## 2021-07-20 ENCOUNTER — Ambulatory Visit (INDEPENDENT_AMBULATORY_CARE_PROVIDER_SITE_OTHER): Payer: Medicaid Other | Admitting: Obstetrics and Gynecology

## 2021-07-20 ENCOUNTER — Other Ambulatory Visit: Payer: Self-pay

## 2021-07-20 ENCOUNTER — Encounter: Payer: Self-pay | Admitting: Obstetrics and Gynecology

## 2021-07-20 ENCOUNTER — Other Ambulatory Visit (HOSPITAL_COMMUNITY)
Admission: RE | Admit: 2021-07-20 | Discharge: 2021-07-20 | Disposition: A | Discharge status: Home / Self Care | Payer: Medicaid Other | Source: Ambulatory Visit | Attending: Obstetrics and Gynecology | Admitting: Obstetrics and Gynecology

## 2021-07-20 VITALS — BP 110/60 | Ht 65.0 in | Wt 131.0 lb

## 2021-07-20 DIAGNOSIS — N898 Other specified noninflammatory disorders of vagina: Secondary | ICD-10-CM | POA: Diagnosis present

## 2021-07-20 DIAGNOSIS — A599 Trichomoniasis, unspecified: Secondary | ICD-10-CM | POA: Diagnosis not present

## 2021-07-20 DIAGNOSIS — Z113 Encounter for screening for infections with a predominantly sexual mode of transmission: Secondary | ICD-10-CM | POA: Diagnosis present

## 2021-07-20 DIAGNOSIS — Z3201 Encounter for pregnancy test, result positive: Secondary | ICD-10-CM

## 2021-07-20 LAB — POCT WET PREP WITH KOH
Clue Cells Wet Prep HPF POC: NEGATIVE
KOH Prep POC: NEGATIVE
Trichomonas, UA: NEGATIVE
Yeast Wet Prep HPF POC: NEGATIVE

## 2021-07-21 LAB — CERVICOVAGINAL ANCILLARY ONLY
Bacterial Vaginitis (gardnerella): NEGATIVE
Candida Glabrata: NEGATIVE
Candida Vaginitis: NEGATIVE
Chlamydia: NEGATIVE
Comment: NEGATIVE
Comment: NEGATIVE
Comment: NEGATIVE
Comment: NEGATIVE
Comment: NEGATIVE
Comment: NORMAL
Neisseria Gonorrhea: NEGATIVE
Trichomonas: POSITIVE — AB

## 2021-07-21 LAB — BETA HCG QUANT (REF LAB): hCG Quant: 3175 m[IU]/mL

## 2021-07-24 MED ORDER — METRONIDAZOLE 500 MG PO TABS: ORAL_TABLET | ORAL | 0 refills | Status: DC

## 2021-07-24 NOTE — Addendum Note (Signed)
Addended by: Althea Grimmer B on: 07/24/2021 11:32 AM   Modules accepted: Orders

## 2021-08-18 ENCOUNTER — Other Ambulatory Visit (HOSPITAL_COMMUNITY)
Admission: RE | Admit: 2021-08-18 | Discharge: 2021-08-18 | Disposition: A | Discharge status: Home / Self Care | Payer: Medicaid Other | Source: Ambulatory Visit | Attending: Advanced Practice Midwife | Admitting: Advanced Practice Midwife

## 2021-08-18 ENCOUNTER — Ambulatory Visit (INDEPENDENT_AMBULATORY_CARE_PROVIDER_SITE_OTHER): Payer: Medicaid Other | Admitting: Advanced Practice Midwife

## 2021-08-18 ENCOUNTER — Encounter: Payer: Self-pay | Admitting: Advanced Practice Midwife

## 2021-08-18 ENCOUNTER — Other Ambulatory Visit: Payer: Self-pay

## 2021-08-18 VITALS — BP 104/60 | Wt 135.0 lb

## 2021-08-18 DIAGNOSIS — Z3490 Encounter for supervision of normal pregnancy, unspecified, unspecified trimester: Secondary | ICD-10-CM | POA: Insufficient documentation

## 2021-08-18 DIAGNOSIS — Z3401 Encounter for supervision of normal first pregnancy, first trimester: Secondary | ICD-10-CM | POA: Insufficient documentation

## 2021-08-18 DIAGNOSIS — Z113 Encounter for screening for infections with a predominantly sexual mode of transmission: Secondary | ICD-10-CM | POA: Insufficient documentation

## 2021-08-18 DIAGNOSIS — Z369 Encounter for antenatal screening, unspecified: Secondary | ICD-10-CM | POA: Insufficient documentation

## 2021-08-18 DIAGNOSIS — Z130 Encounter for screening for diseases of the blood and blood-forming organs and certain disorders involving the immune mechanism: Secondary | ICD-10-CM

## 2021-08-18 DIAGNOSIS — Z1159 Encounter for screening for other viral diseases: Secondary | ICD-10-CM

## 2021-08-18 NOTE — Patient Instructions (Signed)
Asthma, Adult °Asthma is a long-term (chronic) condition that causes recurrent episodes in which the airways become tight and narrow. The airways are the passages that lead from the nose and mouth down into the lungs. Asthma episodes, also called asthma attacks, can cause coughing, wheezing, shortness of breath, and chest pain. The airways can also fill with mucus. During an attack, it can be difficult to breathe. Asthma attacks can range from minor to life threatening. °Asthma cannot be cured, but medicines and lifestyle changes can help control it and treat acute attacks. °What are the causes? °This condition is believed to be caused by inherited (genetic) and environmental factors, but its exact cause is not known. °There are many things that can bring on an asthma attack or make asthma symptoms worse (triggers). Asthma triggers are different for each person. Common triggers include: °Mold. °Dust. °Cigarette smoke. °Cockroaches. °Things that can cause allergy symptoms (allergens), such as animal dander or pollen from trees or grass. °Air pollutants such as household cleaners, wood smoke, smog, or chemical odors. °Cold air, weather changes, and winds (which increase molds and pollen in the air). °Strong emotional expressions such as crying or laughing hard. °Stress. °Certain medicines (such as aspirin) or types of medicines (such as beta-blockers). °Sulfites in foods and drinks. Foods and drinks that may contain sulfites include dried fruit, potato chips, and sparkling grape juice. °Infections or inflammatory conditions such as the flu, a cold, or inflammation of the nasal membranes (rhinitis). °Gastroesophageal reflux disease (GERD). °Exercise or strenuous activity. °What are the signs or symptoms? °Symptoms of this condition may occur right after asthma is triggered or many hours later. Symptoms include: °Wheezing. This can sound like whistling when you breathe. °Excessive nighttime or early morning  coughing. °Frequent or severe coughing with a common cold. °Chest tightness. °Shortness of breath. °Tiredness (fatigue) with minimal activity. °How is this diagnosed? °This condition is diagnosed based on: °Your medical history. °A physical exam. °Tests, which may include: °Lung function studies and pulmonary studies (spirometry). These tests can evaluate the flow of air in your lungs. °Allergy tests. °Imaging tests, such as X-rays. °How is this treated? °There is no cure for this condition, but treatment can help control your symptoms. Treatment for asthma usually involves: °Identifying and avoiding your asthma triggers. °Using medicines to control your symptoms. Generally, two types of medicines are used to treat asthma: °Controller medicines. These help prevent asthma symptoms from occurring. They are usually taken every day. °Fast-acting reliever or rescue medicines. These quickly relieve asthma symptoms by widening the narrow and tight airways. They are used as needed and provide short-term relief. °Using supplemental oxygen. This may be needed during a severe episode. °Using other medicines, such as: °Allergy medicines, such as antihistamines, if your asthma attacks are triggered by allergens. °Immune medicines (immunomodulators). These are medicines that help control the immune system. °Creating an asthma action plan. An asthma action plan is a written plan for managing and treating your asthma attacks. This plan includes: °A list of your asthma triggers and how to avoid them. °Information about when medicines should be taken and when their dosage should be changed. °Instructions about using a device called a peak flow meter. A peak flow meter measures how well the lungs are working and the severity of your asthma. It helps you monitor your condition. °Follow these instructions at home: °Controlling your home environment °Control your home environment in the following ways to help avoid triggers and prevent  asthma attacks: °Change your heating   and air conditioning filter regularly. °Limit your use of fireplaces and wood stoves. °Get rid of pests (such as roaches and mice) and their droppings. °Throw away plants if you see mold on them. °Clean floors and dust surfaces regularly. Use unscented cleaning products. °Try to have someone else vacuum for you regularly. Stay out of rooms while they are being vacuumed and for a short while afterward. If you vacuum, use a dust mask from a hardware store, a double-layered or microfilter vacuum cleaner bag, or a vacuum cleaner with a HEPA filter. °Replace carpet with wood, tile, or vinyl flooring. Carpet can trap dander and dust. °Use allergy-proof pillows, mattress covers, and box spring covers. °Keep your bedroom a trigger-free room. °Avoid pets and keep windows closed when allergens are in the air. °Wash beddings every week in hot water and dry them in a dryer. °Use blankets that are made of polyester or cotton. °Clean bathrooms and kitchens with bleach. If possible, have someone repaint the walls in these rooms with mold-resistant paint. Stay out of the rooms that are being cleaned and painted. °Wash your hands often with soap and water. If soap and water are not available, use hand sanitizer. °Do not allow anyone to smoke in your home. °General instructions °Take over-the-counter and prescription medicines only as told by your health care provider. °Speak with your health care provider if you have questions about how or when to take the medicines. °Make note if you are requiring more frequent dosages. °Do not use any products that contain nicotine or tobacco, such as cigarettes and e-cigarettes. If you need help quitting, ask your health care provider. Also, avoid being exposed to secondhand smoke. °Use a peak flow meter as told by your health care provider. Record and keep track of the readings. °Understand and use the asthma action plan to help minimize, or stop an asthma  attack, without needing to seek medical care. °Make sure you stay up to date on your yearly vaccinations as told by your health care provider. This may include vaccines for the flu and pneumonia. °Avoid outdoor activities when allergen counts are high and when air quality is low. °Wear a ski mask that covers your nose and mouth during outdoor winter activities. Exercise indoors on cold days if you can. °Warm up before exercising, and take time for a cool-down period after exercise. °Keep all follow-up visits as told by your health care provider. This is important. °Where to find more information °For information about asthma, turn to the Centers for Disease Control and Prevention at www.cdc.gov/asthma/faqs °For air quality information, turn to AirNow at airnow.gov °Contact a health care provider if: °You have wheezing, shortness of breath, or a cough even while you are taking medicine to prevent attacks. °The mucus you cough up (sputum) is thicker than usual. °Your sputum changes from clear or white to yellow, green, gray, or bloody. °Your medicines are causing side effects, such as a rash, itching, swelling, or trouble breathing. °You need to use a reliever medicine more than 2-3 times a week. °Your peak flow reading is still at 50-79% of your personal best after following your action plan for 1 hour. °You have a fever. °Get help right away if: °You are getting worse and do not respond to treatment during an asthma attack. °You are short of breath when at rest or when doing very little physical activity. °You have difficulty eating, drinking, or talking. °You have chest pain or tightness. °You develop a fast heartbeat or   palpitations. You have a bluish color to your lips or fingernails. You are light-headed or dizzy, or you faint. Your peak flow reading is less than 50% of your personal best. You feel too tired to breathe normally. Summary Asthma is a long-term (chronic) condition that causes recurrent  episodes in which the airways become tight and narrow. These episodes can cause coughing, wheezing, shortness of breath, and chest pain. Asthma cannot be cured, but medicines and lifestyle changes can help control it and treat acute attacks. Make sure you understand how to avoid triggers and how and when to use your medicines. Asthma attacks can range from minor to life threatening. Get help right away if you have an asthma attack and do not respond to treatment with your usual rescue medicines. This information is not intended to replace advice given to you by your health care provider. Make sure you discuss any questions you have with your health care provider. Document Revised: 09/03/2020 Document Reviewed: 04/07/2020 Else[employer] Patient Education  2022 [employer] Exercise During Pregnancy Exercise is an important part of being healthy for people of all ages. Exercise improves the function of your heart and lungs and helps you maintain strength, flexibility, and a healthy body weight. Exercise also boosts energy levels and elevates mood. Most women should exercise regularly during pregnancy. Exercise routines may need to change as your pregnancy progresses. In rare cases, women with certain medical conditions or complications may be asked to limit or avoid exercise during pregnancy. Your health care provider will give you information on what will work for you. How does this affect me? Along with maintaining general strength and flexibility, exercising during pregnancy can help: Keep strength in muscles that are used during labor and childbirth. Decrease low back pain or symptoms of depression. Control weight gain during pregnancy. Reduce the risk of needing insulin if you develop diabetes during pregnancy. Decrease the risk of cesarean delivery. Speed up your recovery after giving birth. Relieve constipation. How does this affect my baby? Exercise can help you have a healthy pregnancy.  Exercise does not cause early (premature) birth. It will not cause your baby to weigh less at birth. What exercises can I do? Many exercises are safe for you to do during pregnancy. Do a variety of exercises that safely increase your heart and breathing rates and help you build and maintain muscle strength. Do exercises exactly as told by your health care provider. You may do these exercises: Walking. Swimming. Water aerobics. Riding a stationary bike. Modified yoga or Pilates. Tell your instructor that you are pregnant. Avoid overstretching, and avoid lying on your back for long periods of time. Running or jogging. Choose this type of exercise only if: You ran or jogged regularly before your pregnancy. You can run or jog and still talk in complete sentences. What exercises should I avoid? You may be told to limit high-intensity exercise depending on your level of fitness and whether you exercised regularly before you were pregnant. You can tell that you are exercising at a high intensity if you are breathing much harder and faster and cannot hold a conversation while exercising. You must avoid: Contact sports. Activities that put you at risk for falling on or being hit in the belly, such as downhill skiing, waterskiing, surfing, rock climbing, cycling, gymnastics, and horseback riding. Scuba diving. Skydiving. Hot yoga or hot Pilates. These activities take place in a room that is heated to high temperatures. Jogging or running, unless you jogged or ran regularly  before you were pregnant. While jogging or running, you should always be able to talk in full sentences. Do not run or jog so fast that you are unable to have a conversation. Do not exercise at more than 6,000 feet above sea level (high elevation) if you are not used to exercising at high elevation. How do I exercise in a safe way?  Avoid overheating. Do not exercise in very high temperatures. Wear loose-fitting, breathable  clothes. Avoid dehydration. Drink enough fluid before, during, and after exercise to keep your urine pale yellow. Avoid overstretching. Because of hormone changes during pregnancy, it is easy to overstretch muscles, tendons, and ligaments. Start slowly and ask your health care provider to recommend the types of exercise that are safe for you. Do not exercise to lose weight. Wear a sports bra to support your breasts. Avoid standing still or lying flat on your back as much as you can. Follow these instructions at home: Exercise on most days or all days of the week. Try to exercise for 30 minutes a day, 5 days a week, unless your health care provider tells you not to. If you actively exercised before your pregnancy and you are healthy, your health care provider may tell you to continue to do moderate-intensity to high-intensity exercise. If you are just starting to exercise or did not exercise much before your pregnancy, your health care provider may tell you to do low-intensity to moderate-intensity exercise. Questions to ask your health care provider Is exercise safe for me? What are signs that I should stop exercising? Does my health condition mean that I should not exercise during pregnancy? When should I avoid exercising during pregnancy? Stop exercising and contact a health care provider if: You have any unusual symptoms such as: Mild contractions of the uterus or cramps in the abdomen. A dizzy feeling that does not go away when you rest. Stop exercising and get help right away if: You have any unusual symptoms such as: Sudden, severe pain in your low back or your belly. Regular, painful contractions of your uterus. Chest pain. Bleeding or fluid leaking from your vagina. Shortness of breath. Headache. Pain and swelling of your calves. Summary Most women should exercise regularly throughout pregnancy. In rare cases, women with certain medical conditions or complications may be asked  to limit or avoid exercise during pregnancy. Do not exercise to lose weight during pregnancy. Your health care provider will tell you what level of physical activity is right for you. Stop exercising and contact a health care provider if you have unusual symptoms, such as mild contractions or dizziness. This information is not intended to replace advice given to you by your health care provider. Make sure you discuss any questions you have with your health care provider. Document Revised: 07/21/2020 Document Reviewed: 07/21/2020 Elsevier Patient Education  2022 [employer] Eating Plan for Pregnant Women While you are pregnant, your body requires additional nutrition to help support your growing baby. You also have a higher need for some vitamins and minerals, such as folic acid, calcium, iron, and vitamin D. Eating a healthy, well-balanced diet is very important for your health and your baby's health. Your need for extra calories varies over the course of your pregnancy. Pregnancy is divided into three trimesters, with each trimester lasting 3 months. For most women, it is recommended to consume: 150 extra calories a day during the first trimester. 300 extra calories a day during the second trimester. 300 extra calories a day during  the third trimester. What are tips for following this plan? Cooking Practice good food safety and cleanliness. Wash your hands before you eat and after you prepare raw meat. Wash all fruits and vegetables well before peeling or eating. Taking these actions can help to prevent foodborne illnesses that can be very dangerous to your baby, such as listeriosis. Ask your health care provider for more information about listeriosis. Make sure that all meats, poultry, and eggs are cooked to food-safe temperatures or "well-done." Meal planning  Eat a variety of foods (especially fruits and vegetables) to get a full range of vitamins and minerals. Two or more servings of fish  are recommended each week in order to get the most benefits from omega-3 fatty acids that are found in seafood. Choose fish that are lower in mercury, such as salmon and pollock. Limit your overall intake of foods that have "empty calories." These are foods that have little nutritional value, such as sweets, desserts, candies, and sugar-sweetened beverages. Drinks that contain caffeine are okay to drink, but it is better to avoid caffeine. Keep your total caffeine intake to less than 200 mg each day (which is 12 oz or 355 mL of coffee, tea, or soda) or the limit as told by your health care provider. General information Do not try to lose weight or go on a diet during pregnancy. Take a prenatal vitamin to help meet your additional vitamin and mineral needs during pregnancy, specifically for folic acid, iron, calcium, and vitamin D. Remember to stay active. Ask your health care provider what types of exercise and activities are safe for you. What does 150 extra calories look like? Healthy options that provide 150 extra calories each day could be any of the following: 6-8 oz (170-227 g) plain low-fat yogurt with  cup (70 g) berries. 1 apple with 2 tsp (11 g) peanut butter. Cut-up vegetables with  cup (60 g) hummus. 8 fl oz (237 mL) low-fat chocolate milk. 1 stick of string cheese with 1 medium orange. 1 peanut butter and jelly sandwich that is made with one slice of whole-wheat bread and 1 tsp (5 g) of peanut butter. For 300 extra calories, you could eat two of these healthy options each day. What is a healthy amount of weight to gain? The right amount of weight gain for you is based on your BMI (body mass index) before you became pregnant. If your BMI was less than 18 (underweight), you should gain 28-40 lb (13-18 kg). If your BMI was 18-24.9 (normal), you should gain 25-35 lb (11-16 kg). If your BMI was 25-29.9 (overweight), you should gain 15-25 lb (7-11 kg). If your BMI was 30 or greater  (obese), you should gain 11-20 lb (5-9 kg). What if I am having twins or multiples? Generally, if you are carrying twins or multiples: You may need to eat 300-600 extra calories a day. The recommended range for total weight gain is 25-54 lb (11-25 kg), depending on your BMI before pregnancy. Talk with your health care provider to find out about nutritional needs, weight gain, and exercise that is right for you. What foods should I eat? Fruits All fruits. Eat a variety of colors and types of fruit. Remember to wash your fruits well before peeling or eating. Vegetables All vegetables. Eat a variety of colors and types of vegetables. Remember to wash your vegetables well before peeling or eating. Grains All grains. Choose whole grains, such as whole-wheat bread, oatmeal, or brown rice. Meats and other  protein foods Lean meats, including chicken, [place], and lean cuts of beef, veal, or pork. Fish that is higher in omega-3 fatty acids and lower in mercury, such as salmon, herring, mussels, trout, sardines, pollock, shrimp, crab, and lobster. Tofu. Tempeh. Beans. Eggs. Peanut butter and other nut butters. Dairy Pasteurized milk and milk alternatives, such as almond milk. Pasteurized yogurt and pasteurized cheese. Cottage cheese. Sour cream. Beverages Water. Juices that contain 100% fruit juice or vegetable juice. Caffeine-free teas and decaffeinated coffee. Fats and oils Fats and oils are okay to include in moderation. Sweets and desserts Sweets and desserts are okay to include in moderation. Seasoning and other foods All pasteurized condiments. The items listed above may not be a complete list of foods and beverages you can eat. Contact a dietitian for more information. What foods should I avoid? Fruits Raw (unpasteurized) fruit juices. Vegetables Unpasteurized vegetable juices. Meats and other protein foods Precooked or cured meat, such as bologna, hot dogs, sausages, or meat loaves. (If  you must eat those meats, reheat them until they are steaming hot.) Refrigerated pate, meat spreads from a meat counter, or smoked seafood that is found in the refrigerated section of a store. Raw or undercooked meats, poultry, and eggs. Raw fish, such as sushi or sashimi. Fish that have high mercury content, such as tilefish, shark, swordfish, and king mackerel. Dairy Unpasteurized milk and any foods that have unpasteurized milk in them. Soft cheeses, such as feta, queso blanco, queso fresco, [place], [place], panela, and blue-veined cheeses (unless they are made with pasteurized milk, which must be stated on the label). Beverages Alcohol. Sugar-sweetened beverages, such as sodas, teas, or energy drinks. Seasoning and other foods Homemade fermented foods and drinks, such as pickles, sauerkraut, or kombucha drinks. (Store-bought pasteurized versions of these are okay.) Salads that are made in a store or deli, such as ham salad, chicken salad, egg salad, tuna salad, and seafood salad. The items listed above may not be a complete list of foods and beverages you should avoid. Contact a dietitian for more information. Where to find more information To calculate the number of calories you need based on your height, weight, and activity level, you can use an online calculator such as: PayStrike.dk To calculate how much weight you should gain during pregnancy, you can use an online pregnancy weight gain calculator such as: http://www.harvey.com/ To learn more about eating fish during pregnancy, talk with your health care provider or visit: PumpkinSearch.com.ee Summary While you are pregnant, your body requires additional nutrition to help support your growing baby. Eat a variety of foods, especially fruits and vegetables, to get a full range of vitamins and minerals. Practice good food safety and cleanliness. Wash your hands before you eat and after you prepare raw meat. Wash all fruits and vegetables  well before peeling or eating. Taking these actions can help to prevent foodborne illnesses, such as listeriosis, that can be very dangerous to your baby. Do not eat raw meat or fish. Do not eat fish that have high mercury content, such as tilefish, shark, swordfish, and king mackerel. Do not eat raw (unpasteurized) dairy. Take a prenatal vitamin to help meet your additional vitamin and mineral needs during pregnancy, specifically for folic acid, iron, calcium, and vitamin D. This information is not intended to replace advice given to you by your health care provider. Make sure you discuss any questions you have with your health care provider. Document Revised: 07/01/2020 Document Reviewed: 07/01/2020 Elsevier Patient Education  2022 [employer]  Inc[employer] Prenatal Care Prenatal care is health care during pregnancy. It helps you and your unborn baby (fetus) stay as healthy as possible. Prenatal care may be provided by a midwife, a family practice doctor, a Dispensing optician (nurse practitioner or physician assistant), or a childbirth and pregnancy doctor (obstetrician). How does this affect me? During pregnancy, you will be closely monitored for any new conditions that might develop. To lower your risk of pregnancy complications, you and your health care provider will talk about any underlying conditions you have. How does this affect my baby? Early and consistent prenatal care increases the chance that your baby will be healthy during pregnancy. Prenatal care lowers the risk that your baby will be: Born early (prematurely). Smaller than expected at birth (small for gestational age). What can I expect at the first prenatal care visit? Your first prenatal care visit will likely be the longest. You should schedule your first prenatal care visit as soon as you know that you are pregnant. Your first visit is a good time to talk about any questions or concerns you have about pregnancy. Medical history At  your visit, you and your health care provider will talk about your medical history, including: Any past pregnancies. Your family's medical history. Medical history of the baby's father. Any long-term (chronic) health conditions you have and how you manage them. Any surgeries or procedures you have had. Any current over-the-counter or prescription medicines, herbs, or supplements that you are taking. Other factors that could pose a risk to your baby, including: Exposure to harmful chemicals or radiation at work or at home. Any substance use, including tobacco, alcohol, and drug use. Your home setting and your stress levels, including: Exposure to abuse or violence. Household financial strain. Your daily health habits, including diet and exercise. Tests and screenings Your health care provider will: Measure your weight, height, and blood pressure. Do a physical exam, including a pelvic and breast exam. Perform blood tests and urine tests to check for: Urinary tract infection. Sexually transmitted infections (STIs). Low iron levels in your blood (anemia). Blood type and certain proteins on red blood cells (Rh antibodies). Infections and immunity to viruses, such as hepatitis B and rubella. HIV (human immunodeficiency virus). Discuss your options for genetic screening. Tips about staying healthy Your health care provider will also give you information about how to keep yourself and your baby healthy, including: Nutrition and taking vitamins. Physical activity. How to manage pregnancy symptoms such as nausea and vomiting (morning sickness). Infections and substances that may be harmful to your baby and how to avoid them. Food safety. Dental care. Working. Travel. Warning signs to watch for and when to call your health care provider. How often will I have prenatal care visits? After your first prenatal care visit, you will have regular visits throughout your pregnancy. The visit  schedule is often as follows: Up to week 28 of pregnancy: once every 4 weeks. 28-36 weeks: once every 2 weeks. After 36 weeks: every week until delivery. Some women may have visits more or less often depending on any underlying health conditions and the health of the baby. Keep all follow-up and prenatal care visits. This is important. What happens during routine prenatal care visits? Your health care provider will: Measure your weight and blood pressure. Check for fetal heart sounds. Measure the height of your uterus in your abdomen (fundal height). This may be measured starting around week 20 of pregnancy. Check the position of your baby inside your uterus.  Ask questions about your diet, sleeping patterns, and whether you can feel the baby move. Review warning signs to watch for and signs of labor. Ask about any pregnancy symptoms you are having and how you are dealing with them. Symptoms may include: Headaches. Nausea and vomiting. Vaginal discharge. Swelling. Fatigue. Constipation. Changes in your vision. Feeling persistently sad or anxious. Any discomfort, including back or pelvic pain. Bleeding or spotting. Make a list of questions to ask your health care provider at your routine visits. What tests might I have during prenatal care visits? You may have blood, urine, and imaging tests throughout your pregnancy, such as: Urine tests to check for glucose, protein, or signs of infection. Glucose tests to check for a form of diabetes that can develop during pregnancy (gestational diabetes mellitus). This is usually done around week 24 of pregnancy. Ultrasounds to check your baby's growth and development, to check for birth defects, and to check your baby's well-being. These can also help to decide when you should deliver your baby. A test to check for group B strep (GBS) infection. This is usually done around week 36 of pregnancy. Genetic testing. This may include blood, fluid, or  tissue sampling, or imaging tests, such as an ultrasound. Some genetic tests are done during the first trimester and some are done during the second trimester. What else can I expect during prenatal care visits? Your health care provider may recommend getting certain vaccines during pregnancy. These may include: A yearly flu shot (annual influenza vaccine). This is especially important if you will be pregnant during flu season. Tdap (tetanus, diphtheria, pertussis) vaccine. Getting this vaccine during pregnancy can protect your baby from whooping cough (pertussis) after birth. This vaccine may be recommended between weeks 27 and 36 of pregnancy. A COVID-19 vaccine. Later in your pregnancy, your health care provider may give you information about: Childbirth and breastfeeding classes. Choosing a health care provider for your baby. Umbilical cord banking. Breastfeeding. Birth control after your baby is born. The hospital labor and delivery unit and how to set up a tour. Registering at the hospital before you go into labor. Where to find more information Office on Women's Health: TravelLesson.ca American Pregnancy Association: americanpregnancy.org March of Dimes: marchofdimes.org Summary Prenatal care helps you and your baby stay as healthy as possible during pregnancy. Your first prenatal care visit will most likely be the longest. You will have visits and tests throughout your pregnancy to monitor your health and your baby's health. Bring a list of questions to your visits to ask your health care provider. Make sure to keep all follow-up and prenatal care visits. This information is not intended to replace advice given to you by your health care provider. Make sure you discuss any questions you have with your health care provider. Document Revised: 09/16/2020 Document Reviewed: 09/16/2020 Elsevier Patient Education  2022 [employer].

## 2021-08-18 NOTE — Progress Notes (Signed)
NOB- no concerns ?
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
## 2021-08-22 LAB — URINE CULTURE

## 2021-08-23 LAB — CERVICOVAGINAL ANCILLARY ONLY
Chlamydia: NEGATIVE
Comment: NEGATIVE
Comment: NEGATIVE
Comment: NORMAL
Neisseria Gonorrhea: NEGATIVE
Trichomonas: NEGATIVE

## 2021-08-31 ENCOUNTER — Other Ambulatory Visit: Payer: Self-pay

## 2021-08-31 ENCOUNTER — Encounter: Payer: Self-pay | Admitting: Obstetrics and Gynecology

## 2021-08-31 ENCOUNTER — Ambulatory Visit (INDEPENDENT_AMBULATORY_CARE_PROVIDER_SITE_OTHER): Payer: Medicaid Other | Admitting: Obstetrics and Gynecology

## 2021-08-31 VITALS — BP 124/74 | Wt 134.0 lb

## 2021-08-31 DIAGNOSIS — Z3A11 11 weeks gestation of pregnancy: Secondary | ICD-10-CM

## 2021-08-31 DIAGNOSIS — Z1379 Encounter for other screening for genetic and chromosomal anomalies: Secondary | ICD-10-CM

## 2021-08-31 DIAGNOSIS — O219 Vomiting of pregnancy, unspecified: Secondary | ICD-10-CM

## 2021-08-31 DIAGNOSIS — O23599 Infection of other part of genital tract in pregnancy, unspecified trimester: Secondary | ICD-10-CM | POA: Insufficient documentation

## 2021-08-31 DIAGNOSIS — O210 Mild hyperemesis gravidarum: Secondary | ICD-10-CM

## 2021-08-31 DIAGNOSIS — F32A Depression, unspecified: Secondary | ICD-10-CM

## 2021-08-31 DIAGNOSIS — A5901 Trichomonal vulvovaginitis: Secondary | ICD-10-CM

## 2021-08-31 DIAGNOSIS — Z369 Encounter for antenatal screening, unspecified: Secondary | ICD-10-CM

## 2021-08-31 DIAGNOSIS — Z3401 Encounter for supervision of normal first pregnancy, first trimester: Secondary | ICD-10-CM

## 2021-08-31 DIAGNOSIS — F419 Anxiety disorder, unspecified: Secondary | ICD-10-CM

## 2021-08-31 DIAGNOSIS — O23591 Infection of other part of genital tract in pregnancy, first trimester: Secondary | ICD-10-CM

## 2021-08-31 LAB — OB RESULTS CONSOLE VARICELLA ZOSTER ANTIBODY, IGG
Varicella: IMMUNE
Varicella: NON-IMMUNE/NOT IMMUNE

## 2021-08-31 MED ORDER — SERTRALINE HCL 50 MG PO TABS: 50.0000 mg | ORAL_TABLET | Freq: Every day | ORAL | 2 refills | Status: DC

## 2021-08-31 MED ORDER — ONDANSETRON 4 MG PO TBDP: 4.0000 mg | ORAL_TABLET | Freq: Three times a day (TID) | ORAL | 1 refills | Status: DC | PRN

## 2021-08-31 NOTE — Addendum Note (Signed)
Addended by: Liliane Shi on: 08/31/2021 12:02 PM   Modules accepted: Orders
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
## 2021-08-31 NOTE — Progress Notes (Signed)
ULTRASOUND REPORT  Location: [facility] Date of Service: 08/31/2021   Indications:dating (transabdominal) Findings:  Singleton intrauterine pregnancy is visualized with a CRL consistent with [redacted]w[redacted]d gestation, giving an (U/S) EDD of 03/20/2022. The (U/S) EDD is consistent with the clinically established EDD of 03/20/2022.  FHR: 162 BPM CRL measurement: 45.3 mm Yolk sac is not visualized. Amnion: visualized and appears normal   Right Ovary is normal in appearance. Left Ovary is not visualized. Corpus luteal cyst:  is not visualized Survey of the adnexa demonstrates no adnexal masses. There is no free peritoneal fluid in the cul de sac.  Impression: 1. [redacted]w[redacted]d Viable Singleton Intrauterine pregnancy by U/S. 2. (U/S) EDD is consistent with Clinically established EDD of 03/20/2022.  There is a viable singleton gestation.  Detailed evaluation of the fetal anatomy is precluded by early gestational age.  It must be noted that a normal ultrasound particular at this early gestational age is unable to rule out fetal aneuploidy, risk of first trimester miscarriage, or anatomic birth defects.  I personally performed the ultrasound and interpreted the images.   Thomasene Mohair, MD, [facility], [facility] 08/31/2021 12:18 PM

## 2021-08-31 NOTE — Patient Instructions (Signed)
For nausea (these may be purchased over-the-counter): -Vitamin B6 (pyridoxine):  25 mg three times each day (may buy 100 mg tablet and take twice per day or try to cut into 4 equal pieces and take 1 piece three times each day).  - doxylamine (found in [employer] and other sleep agents that can be bought in the store): take 25 - 50 mg at bedtime.  May take up to 25 mg three time each day.  However, keep in mind that this might make you sleepy.  

## 2021-09-02 LAB — RPR+RH+ABO+RUB AB+AB SCR+CB...
Antibody Screen: NEGATIVE
HIV Screen 4th Generation wRfx: NONREACTIVE
Hematocrit: 34.7 % (ref 34.0–46.6)
Hemoglobin: 11.8 g/dL (ref 11.1–15.9)
Hepatitis B Surface Ag: NEGATIVE
MCH: 31.2 pg (ref 26.6–33.0)
MCHC: 34 g/dL (ref 31.5–35.7)
MCV: 92 fL (ref 79–97)
Platelets: 284 10*3/uL (ref 150–450)
RBC: 3.78 x10E6/uL (ref 3.77–5.28)
RDW: 13.1 % (ref 11.7–15.4)
RPR Ser Ql: NONREACTIVE
Rh Factor: POSITIVE
Rubella Antibodies, IGG: 3.99 index (ref >0.99)
Varicella zoster IgG: 305 index (ref >165)
WBC: 11 10*3/uL — ABNORMAL HIGH (ref 3.4–10.8)

## 2021-09-02 LAB — HGB FRACTIONATION CASCADE
Hgb A2: 2.5 % (ref 1.8–3.2)
Hgb A: 96.8 % (ref 96.4–98.8)
Hgb F: 0.7 % (ref 0.0–2.0)
Hgb S: 0 %

## 2021-09-02 LAB — HEPATITIS C ANTIBODY: Hep C Virus Ab: <0.1 s/co ratio (ref 0.0–0.9)

## 2021-09-05 ENCOUNTER — Telehealth: Payer: Self-pay

## 2021-09-05 LAB — MATERNIT 21 PLUS CORE, BLOOD
Fetal Fraction: 8
Result (T21): NEGATIVE
Trisomy 13 (Patau syndrome): NEGATIVE
Trisomy 18 (Edwards syndrome): NEGATIVE
Trisomy 21 (Down syndrome): NEGATIVE

## 2021-09-05 NOTE — Telephone Encounter (Signed)
Patient has a question about her recent labs. Please advise

## 2021-10-03 ENCOUNTER — Ambulatory Visit (INDEPENDENT_AMBULATORY_CARE_PROVIDER_SITE_OTHER): Payer: Medicaid Other | Admitting: Obstetrics and Gynecology

## 2021-10-03 ENCOUNTER — Other Ambulatory Visit: Payer: Self-pay

## 2021-10-03 ENCOUNTER — Encounter: Payer: Self-pay | Admitting: Obstetrics and Gynecology

## 2021-10-03 VITALS — BP 116/80 | Wt 138.0 lb

## 2021-10-03 DIAGNOSIS — Z3A16 16 weeks gestation of pregnancy: Secondary | ICD-10-CM

## 2021-10-03 DIAGNOSIS — Z3401 Encounter for supervision of normal first pregnancy, first trimester: Secondary | ICD-10-CM
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
## 2021-10-24 ENCOUNTER — Ambulatory Visit
Admission: RE | Admit: 2021-10-24 | Discharge: 2021-10-24 | Disposition: A | Discharge status: Home / Self Care | Payer: Medicaid Other | Source: Ambulatory Visit | Attending: Obstetrics and Gynecology | Admitting: Obstetrics and Gynecology

## 2021-10-24 ENCOUNTER — Other Ambulatory Visit: Payer: Self-pay

## 2021-10-24 DIAGNOSIS — Z3401 Encounter for supervision of normal first pregnancy, first trimester: Secondary | ICD-10-CM | POA: Diagnosis present

## 2021-10-28 ENCOUNTER — Encounter: Payer: Self-pay | Admitting: Obstetrics and Gynecology

## 2021-10-28 ENCOUNTER — Ambulatory Visit (INDEPENDENT_AMBULATORY_CARE_PROVIDER_SITE_OTHER): Payer: Medicaid Other | Admitting: Obstetrics and Gynecology

## 2021-10-28 ENCOUNTER — Other Ambulatory Visit: Payer: Self-pay

## 2021-10-28 ENCOUNTER — Encounter: Payer: Medicaid Other | Admitting: Obstetrics and Gynecology

## 2021-10-28 DIAGNOSIS — J45990 Exercise induced bronchospasm: Secondary | ICD-10-CM

## 2021-10-28 DIAGNOSIS — O99512 Diseases of the respiratory system complicating pregnancy, second trimester: Secondary | ICD-10-CM

## 2021-10-28 DIAGNOSIS — Z3A19 19 weeks gestation of pregnancy: Secondary | ICD-10-CM

## 2021-10-28 DIAGNOSIS — O99519 Diseases of the respiratory system complicating pregnancy, unspecified trimester: Secondary | ICD-10-CM

## 2021-10-28 DIAGNOSIS — Z3402 Encounter for supervision of normal first pregnancy, second trimester: Secondary | ICD-10-CM

## 2021-10-28 DIAGNOSIS — J339 Nasal polyp, unspecified: Secondary | ICD-10-CM

## 2021-10-28 DIAGNOSIS — J45909 Unspecified asthma, uncomplicated: Secondary | ICD-10-CM

## 2021-10-28 MED ORDER — ALBUTEROL SULFATE HFA 108 (90 BASE) MCG/ACT IN AERS: 2.0000 | INHALATION_SPRAY | Freq: Four times a day (QID) | RESPIRATORY_TRACT | 5 refills | Status: DC | PRN

## 2021-10-28 MED ORDER — FLUTICASONE PROPIONATE 50 MCG/ACT NA SUSP: 2.0000 | Freq: Every day | NASAL | 3 refills | Status: DC
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
## 2021-11-07 ENCOUNTER — Ambulatory Visit: Payer: Medicaid Other

## 2021-11-15 ENCOUNTER — Telehealth: Payer: Self-pay | Admitting: Obstetrics and Gynecology

## 2021-11-15 ENCOUNTER — Other Ambulatory Visit: Payer: Self-pay | Admitting: Obstetrics and Gynecology

## 2021-11-15 DIAGNOSIS — F419 Anxiety disorder, unspecified: Secondary | ICD-10-CM

## 2021-11-15 DIAGNOSIS — Z3401 Encounter for supervision of normal first pregnancy, first trimester: Secondary | ICD-10-CM

## 2021-11-15 DIAGNOSIS — F32A Depression, unspecified: Secondary | ICD-10-CM

## 2021-11-15 MED ORDER — SERTRALINE HCL 50 MG PO TABS: 50.0000 mg | ORAL_TABLET | Freq: Every day | ORAL | 2 refills | Status: DC

## 2021-11-15 NOTE — Telephone Encounter (Signed)
I contacted the pt and let her know about the refills.

## 2021-11-15 NOTE — Telephone Encounter (Signed)
This pt's mother called.  The pt needs a refill on her Zoloft.  She hasn't had it and has not been able to work.  Can this pt get a refill for this medication?

## 2021-11-18 ENCOUNTER — Ambulatory Visit
Admission: RE | Admit: 2021-11-18 | Discharge: 2021-11-18 | Disposition: A | Discharge status: Home / Self Care | Payer: Medicaid Other | Source: Ambulatory Visit | Attending: Obstetrics and Gynecology | Admitting: Obstetrics and Gynecology

## 2021-11-18 DIAGNOSIS — Z3402 Encounter for supervision of normal first pregnancy, second trimester: Secondary | ICD-10-CM | POA: Insufficient documentation

## 2021-11-25 ENCOUNTER — Encounter: Payer: Medicaid Other | Admitting: Obstetrics

## 2021-11-29 ENCOUNTER — Encounter: Payer: Medicaid Other | Admitting: Licensed Practical Nurse

## 2021-12-05 ENCOUNTER — Ambulatory Visit (INDEPENDENT_AMBULATORY_CARE_PROVIDER_SITE_OTHER): Payer: Medicaid Other | Admitting: Obstetrics

## 2021-12-05 ENCOUNTER — Other Ambulatory Visit: Payer: Self-pay

## 2021-12-05 VITALS — BP 96/62 | Wt 142.0 lb

## 2021-12-05 DIAGNOSIS — Z3402 Encounter for supervision of normal first pregnancy, second trimester: Secondary | ICD-10-CM

## 2021-12-05 DIAGNOSIS — F411 Generalized anxiety disorder: Secondary | ICD-10-CM

## 2021-12-05 DIAGNOSIS — Z3401 Encounter for supervision of normal first pregnancy, first trimester: Secondary | ICD-10-CM

## 2021-12-05 DIAGNOSIS — Z3A25 25 weeks gestation of pregnancy: Secondary | ICD-10-CM

## 2021-12-05 LAB — POCT URINALYSIS DIPSTICK OB: Glucose, UA: NEGATIVE

## 2021-12-05 MED ORDER — SERTRALINE HCL 50 MG PO TABS: 75.0000 mg | ORAL_TABLET | Freq: Every day | ORAL | 2 refills | Status: DC
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
## 2021-12-26 ENCOUNTER — Ambulatory Visit (INDEPENDENT_AMBULATORY_CARE_PROVIDER_SITE_OTHER): Payer: Medicaid Other | Admitting: Obstetrics

## 2021-12-26 ENCOUNTER — Other Ambulatory Visit: Payer: Self-pay | Admitting: Obstetrics

## 2021-12-26 ENCOUNTER — Other Ambulatory Visit: Payer: Medicaid Other

## 2021-12-26 ENCOUNTER — Other Ambulatory Visit: Payer: Self-pay

## 2021-12-26 VITALS — BP 104/70

## 2021-12-26 DIAGNOSIS — Z3401 Encounter for supervision of normal first pregnancy, first trimester: Secondary | ICD-10-CM

## 2021-12-26 DIAGNOSIS — Z3A28 28 weeks gestation of pregnancy: Secondary | ICD-10-CM

## 2021-12-26 DIAGNOSIS — Z3402 Encounter for supervision of normal first pregnancy, second trimester: Secondary | ICD-10-CM

## 2021-12-26 NOTE — Progress Notes (Signed)
No vb. No lof. Pt doing glucose drink at [place]
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
## 2022-01-02 ENCOUNTER — Other Ambulatory Visit: Payer: Medicaid Other

## 2022-01-02 ENCOUNTER — Other Ambulatory Visit: Payer: Self-pay

## 2022-01-03 LAB — GLUCOSE TOLERANCE, 1 HOUR: Glucose, 1Hr PP: 148 mg/dL (ref 70–199)

## 2022-01-09 ENCOUNTER — Other Ambulatory Visit: Payer: Self-pay | Admitting: Obstetrics

## 2022-01-09 ENCOUNTER — Other Ambulatory Visit: Payer: Self-pay

## 2022-01-09 ENCOUNTER — Encounter: Payer: Self-pay | Admitting: Obstetrics

## 2022-01-09 ENCOUNTER — Ambulatory Visit (INDEPENDENT_AMBULATORY_CARE_PROVIDER_SITE_OTHER): Payer: Medicaid Other | Admitting: Licensed Practical Nurse

## 2022-01-09 ENCOUNTER — Encounter: Payer: Self-pay | Admitting: Licensed Practical Nurse

## 2022-01-09 VITALS — BP 114/70 | Wt 152.0 lb

## 2022-01-09 DIAGNOSIS — R7309 Other abnormal glucose: Secondary | ICD-10-CM

## 2022-01-09 DIAGNOSIS — Z23 Encounter for immunization: Secondary | ICD-10-CM

## 2022-01-09 DIAGNOSIS — Z3A30 30 weeks gestation of pregnancy: Secondary | ICD-10-CM

## 2022-01-09 DIAGNOSIS — Z3401 Encounter for supervision of normal first pregnancy, first trimester: Secondary | ICD-10-CM

## 2022-01-09 NOTE — Progress Notes (Signed)
Hi. This patient had an elevated glucose test  and needs to set up a three hour test. I sent her a My Chart message,so I hope she will call.  Mirna Mires, CNM  01/09/2022 2:54 PM

## 2022-01-09 NOTE — Progress Notes (Signed)
No vb. No lof.  
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
## 2022-01-16 ENCOUNTER — Telehealth: Payer: Self-pay

## 2022-01-16 NOTE — Telephone Encounter (Signed)
I contacted patient via phone. Patient states she is unable to scheduled at this time because she doesn't know her work scheduled. Patient states she will call back to be scheduled

## 2022-01-16 NOTE — Telephone Encounter (Signed)
-----   Message from Mirna Mires, CNM sent at 01/09/2022  2:54 PM EST ----- Hi. This patient had an elevated glucose test  and needs to set up a three hour test. I sent her a My Chart message,so I hope she will call.  Mirna Mires, CNM  01/09/2022 2:54 PM

## 2022-01-19 NOTE — Telephone Encounter (Signed)
Called and left voicemail for patient to call back to be scheduled. 

## 2022-01-24 ENCOUNTER — Ambulatory Visit (INDEPENDENT_AMBULATORY_CARE_PROVIDER_SITE_OTHER): Payer: Medicaid Other | Admitting: Licensed Practical Nurse

## 2022-01-24 ENCOUNTER — Encounter: Payer: Self-pay | Admitting: Licensed Practical Nurse

## 2022-01-24 ENCOUNTER — Other Ambulatory Visit: Payer: Self-pay

## 2022-01-24 VITALS — BP 100/70 | Wt 156.0 lb

## 2022-01-24 DIAGNOSIS — F419 Anxiety disorder, unspecified: Secondary | ICD-10-CM

## 2022-01-24 DIAGNOSIS — Z3401 Encounter for supervision of normal first pregnancy, first trimester: Secondary | ICD-10-CM

## 2022-01-24 DIAGNOSIS — F32A Depression, unspecified: Secondary | ICD-10-CM

## 2022-01-24 DIAGNOSIS — Z3A32 32 weeks gestation of pregnancy: Secondary | ICD-10-CM

## 2022-01-24 LAB — POCT URINALYSIS DIPSTICK OB
Glucose, UA: NEGATIVE
POC,PROTEIN,UA: NEGATIVE

## 2022-01-24 MED ORDER — SERTRALINE HCL 50 MG PO TABS
100.0000 mg | ORAL_TABLET | Freq: Every day | ORAL | 1 refills | Status: DC
Start: 2022-01-24 — End: 2022-07-27

## 2022-01-24 NOTE — Addendum Note (Signed)
Addended by: Brien Few on: 01/24/2022 01:19 PM   Modules accepted: Orders
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
## 2022-01-30 ENCOUNTER — Ambulatory Visit (INDEPENDENT_AMBULATORY_CARE_PROVIDER_SITE_OTHER): Payer: Medicaid Other | Admitting: Obstetrics

## 2022-01-30 ENCOUNTER — Other Ambulatory Visit (HOSPITAL_COMMUNITY)
Admission: RE | Admit: 2022-01-30 | Discharge: 2022-01-30 | Disposition: A | Discharge status: Home / Self Care | Payer: Medicaid Other | Source: Ambulatory Visit | Attending: Obstetrics | Admitting: Obstetrics

## 2022-01-30 ENCOUNTER — Other Ambulatory Visit: Payer: Self-pay

## 2022-01-30 ENCOUNTER — Other Ambulatory Visit: Payer: Medicaid Other

## 2022-01-30 VITALS — BP 118/74 | Wt 159.0 lb

## 2022-01-30 DIAGNOSIS — O26893 Other specified pregnancy related conditions, third trimester: Secondary | ICD-10-CM | POA: Diagnosis present

## 2022-01-30 DIAGNOSIS — Z3401 Encounter for supervision of normal first pregnancy, first trimester: Secondary | ICD-10-CM

## 2022-01-30 DIAGNOSIS — N898 Other specified noninflammatory disorders of vagina: Secondary | ICD-10-CM | POA: Insufficient documentation

## 2022-01-30 DIAGNOSIS — R7309 Other abnormal glucose: Secondary | ICD-10-CM

## 2022-01-30 DIAGNOSIS — Z113 Encounter for screening for infections with a predominantly sexual mode of transmission: Secondary | ICD-10-CM | POA: Insufficient documentation

## 2022-01-30 DIAGNOSIS — Z3A33 33 weeks gestation of pregnancy: Secondary | ICD-10-CM

## 2022-01-30 NOTE — Progress Notes (Signed)
3 hr gtt today. No vb. Pt had some vaginal d/c and might have lost her mucous plug
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
## 2022-01-31 LAB — CERVICOVAGINAL ANCILLARY ONLY
Bacterial Vaginitis (gardnerella): NEGATIVE
Chlamydia: NEGATIVE
Comment: NEGATIVE
Comment: NEGATIVE
Comment: NEGATIVE
Comment: NORMAL
Neisseria Gonorrhea: NEGATIVE
Trichomonas: NEGATIVE

## 2022-01-31 LAB — GESTATIONAL GLUCOSE TOLERANCE
Glucose, Fasting: 80 mg/dL (ref 70–94)
Glucose, GTT - 1 Hour: 157 mg/dL (ref 70–179)
Glucose, GTT - 2 Hour: 172 mg/dL — ABNORMAL HIGH (ref 70–154)
Glucose, GTT - 3 Hour: 130 mg/dL (ref 70–139)

## 2022-02-02 ENCOUNTER — Telehealth: Payer: Self-pay

## 2022-02-02 NOTE — Telephone Encounter (Signed)
Pt left msg on triage regarding test results, asking for a call back. Called pt back and advised msg would be sent to provider and she will call her back as soon as she can.

## 2022-02-03 ENCOUNTER — Encounter: Payer: Self-pay | Admitting: Obstetrics

## 2022-02-03 NOTE — Telephone Encounter (Signed)
Called patient back about her 3 hr glucose testing. She passed the 3 hr but saw that one of the values was elevated, so I explained that she still passed the test. Her Aptima swab was also negative for BV, Yeast, and andy GC/CT.  Her mother then got on the phone and shared that she had gotten her daughter to stop working because she is "contracting all the time". She also brought up the vaginal discharge tha her daughter was having (which I had her tested for- all negative). I reassure the mother that her testing fir infection was negative, and that we could see her daughter in the office if she is having regular , painful contractions; that passing some of her mucous plug is not uncommon. I als responded to the mother telling me that her daughter's anatomy scan had been abnormal and she ahd to have another one due to something being wrong with the baby's heart. I reviewed the sono reports- the first scan was not able to visualize the outflow tracts, so we repeated the anatomy scan a month later and everything was normal.  Again I reassured her that her daughter's pregnancy is going well per our evaluation. That increasing her water intake and letting us know if she contracts often  and calling us for a work in appointment is always and option.

## 2022-02-14 ENCOUNTER — Encounter: Payer: Medicaid Other | Admitting: Obstetrics

## 2022-02-16 ENCOUNTER — Other Ambulatory Visit: Payer: Self-pay

## 2022-02-16 ENCOUNTER — Ambulatory Visit (INDEPENDENT_AMBULATORY_CARE_PROVIDER_SITE_OTHER): Payer: Medicaid Other | Admitting: Advanced Practice Midwife

## 2022-02-16 ENCOUNTER — Encounter: Payer: Self-pay | Admitting: Advanced Practice Midwife

## 2022-02-16 VITALS — BP 120/80 | Wt 162.0 lb

## 2022-02-16 DIAGNOSIS — Z3A35 35 weeks gestation of pregnancy: Secondary | ICD-10-CM

## 2022-02-16 DIAGNOSIS — Z3403 Encounter for supervision of normal first pregnancy, third trimester: Secondary | ICD-10-CM

## 2022-02-16 LAB — POCT URINALYSIS DIPSTICK OB
Glucose, UA: NEGATIVE
POC,PROTEIN,UA: NEGATIVE
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
## 2022-02-16 NOTE — Patient Instructions (Signed)
Pain Relief During Labor and Delivery Many things can cause pain during labor and delivery, including: Pressure due to the baby moving through the pelvis. Stretching of tissues due to the baby moving through the birth canal. Muscle tension due to anxiety or nervousness. The uterus tightening (contracting)and relaxing to help move the baby. How do I get pain relief during labor and delivery? Discuss your pain relief options with your health care provider during your prenatal visits. Explore the options offered by your hospital or birth center. There are many ways to deal with the pain of labor and delivery. You can try relaxation techniques or doing relaxing activities, taking a warm shower or bath (hydrotherapy), or other methods. There are also many medicines available to help control pain. Relaxation techniques and activities Practice relaxation techniques or do relaxing activities, such as: Focused breathing. Meditation. Visualization. Aroma therapy. Listening to your favorite music. Hypnosis. Hydrotherapy Take a warm shower or bath. This may: Provide comfort and relaxation. Lessen your feeling of pain. Reduce the amount of pain medicine needed. Shorten the length of labor. Other methods Try doing other things, such as: Getting a massage or having counterpressure on your back. Applying warm packs or ice packs. Changing positions often, moving around, or using a birthing ball. Medicines You may be given: Pain medicine through an IV or an injection into a muscle. Pain medicine inserted into your spinal column. Injections of sterile water just under the skin on your lower back. Nitrous oxide inhalation therapy, also called laughing gas. What kinds of medicine are available for pain relief? There are two kinds of medicines that can be used to relieve pain during labor and delivery: Analgesics. These medicines decrease pain without causing you to lose feeling or the ability to move  your muscles. Anesthetics. These medicines block feeling in the body and can decrease your ability to move freely. Both kinds of medicine can cause minor side effects, such as nausea, trouble concentrating, and sleepiness. They can also affect the baby's heart rate before birth and his or her breathing after birth. For this reason, health care providers are careful about when and how much medicine is given. Which medicines are used to provide pain relief? Common medicines The most common medicines used to help manage pain during labor and delivery include: Opioids. Opioids are medicines that decrease how much pain you feel (perception of pain). These medicines can be given through an IV or may be used with anesthetics to block pain. Epidural analgesia. Epidural analgesia is given through a very thin tube that is inserted into the lower back. Medicine is delivered continuously to the area near your spinal column nerves (epidural space). After having this treatment, you may be able to move your legs, but you will not be able to walk. Depending on the amount and type of medicine given, you may lose all feeling in the lower half of your body, or you may have some sensation, including the urge to push. This treatment can be used to give pain relief for a vaginal birth. Sometimes, a numbing medicine is injected into the spinal fluid when an epidural catheter is placed. This provides for immediate relief but only lasts for 1-2 hours. Once it wears off, the epidural will provide pain relief. This is called a combined spinal-epidural (CSE) block. Intrathecal analgesia (spinal analgesia). Intrathecal analgesia is similar to epidural analgesia, but the medicine is injected into the spinal fluid instead of the epidural space. It is usually only given once. It  starts to relieve pain quickly, but the pain relief lasts only 1-2 hours. °Pudendal block. This block is done by injecting numbing medicine through the wall of  the vagina and into a nerve in the pelvis. °Other medicines °Other medicines used to help manage pain during labor and delivery include: °Local anesthetics. These are used to numb a small area of the body. They may be used along with another kind of medicine or used to numb the nerves of the vagina, cervix, and perineum during the second stage of labor. °Spinal block (spinal anesthesia). Spinal anesthesia is similar to spinal analgesia, but the medicine that is used contains longer-acting numbing medicines and pain medicines. This type of anesthesia can be used for a cesarean delivery and allows you to stay awake for the birth of your baby. °General anesthetics cause you to lose consciousness so you do not feel pain. They are usually only used for an emergency cesarean delivery. These medicines are given through an IV or a mask or both. °These medicines are used as part of a procedure or for an emergency delivery. °Summary °Women have many options to help them manage the pain associated with labor and delivery. °You can try doing relaxing activities, taking a warm shower or bath, or other methods. °There are also many medicines available to help control pain during labor and delivery. °Talk with your health care provider about what options are available to you. °This information is not intended to replace advice given to you by your health care provider. Make sure you discuss any questions you have with your health care provider. °Document Revised: 10/22/2019 Document Reviewed: 10/22/2019 °Elsevier Patient Education © 2022 [employer] °Signs and Symptoms of Labor °Labor is the body's natural process of moving the baby and the placenta out of the uterus. The process of labor usually starts when the baby is full-term, between 39 and 41 weeks of pregnancy. °Signs and symptoms that you are close to going into labor °As your body prepares for labor and the birth of your baby, you may notice the following symptoms in  the weeks and days before true labor starts: °Passing a small amount of thick, bloody mucus from your vagina. This is called normal bloody show or losing your mucus plug. This may happen more than a week before labor begins, or right before labor begins, as the opening of the cervix starts to widen (dilate). For some women, the entire mucus plug passes at once. For others, pieces of the mucus plug may gradually pass over several days. °Your baby moving (dropping) lower in your pelvis to get into position for birth (lightening). When this happens, you may feel more pressure on your bladder and pelvic bone and less pressure on your ribs. This may make it easier to breathe. It may also cause you to need to urinate more often and have problems with bowel movements. °Having "practice contractions," also called Braxton Hicks contractions or false labor. These occur at irregular (unevenly spaced) intervals that are more than 10 minutes apart. False labor contractions are common after exercise or sexual activity. They will stop if you change position, rest, or drink fluids. These contractions are usually mild and do not get stronger over time. They may feel like: °A backache or back pain. °Mild cramps, similar to menstrual cramps. °Tightening or pressure in your abdomen. °Other early symptoms include: °Nausea or loss of appetite. °Diarrhea. °Having a sudden burst of energy, or feeling very tired. °Mood changes. °Having trouble sleeping. °  Signs and symptoms that labor has begun °Signs that you are in labor may include: °Having contractions that come at regular (evenly spaced) intervals and increase in intensity. This may feel like more intense tightening or pressure in your abdomen that moves to your back. °Contractions may also feel like rhythmic pain in your upper thighs or back that comes and goes at regular intervals. °If you are delivering for the first time, this change in intensity of contractions often occurs at a  more gradual pace. °If you have given birth before, you may notice a more rapid progression of contraction changes. °Feeling pressure in the vaginal area. °Your water breaking (rupture of membranes). This is when the sac of fluid that surrounds your baby breaks. Fluid leaking from your vagina may be clear or blood-tinged. Labor usually starts within 24 hours of your water breaking, but it may take longer to begin. °Some people may feel a sudden gush of fluid; others may notice repeatedly damp underwear. °Follow these instructions at home: ° °When labor starts, or if your water breaks, call your health care provider or nurse care line. Based on your situation, they will determine when you should go in for an exam. °During early labor, you may be able to rest and manage symptoms at home. Some strategies to try at home include: °Breathing and relaxation techniques. °Taking a warm bath or shower. °Listening to music. °Using a heating pad on the lower back for pain. If directed, apply heat to the area as often as told by your health care provider. Use the heat source that your health care provider recommends, such as a moist heat pack or a heating pad. °Place a towel between your skin and the heat source. °Leave the heat on for 20-30 minutes. °Remove the heat if your skin turns bright red. This is especially important if you are unable to feel pain, heat, or cold. You have a greater risk of getting burned. °Contact a health care provider if: °Your labor has started. °Your water breaks. °You have nausea, vomiting, or diarrhea. °Get help right away if: °You have painful, regular contractions that are 5 minutes apart or less. °Labor starts before you are [redacted] weeks along in your pregnancy. °You have a fever. °You have bright red blood coming from your vagina. °You do not feel your baby moving. °You have a severe headache with or without vision problems. °You have chest pain or shortness of breath. °These symptoms may  represent a serious problem that is an emergency. Do not wait to see if the symptoms will go away. Get medical help right away. Call your local emergency services (911 in the [place] Do not drive yourself to the hospital. °Summary °Labor is your body's natural process of moving your baby and the placenta out of your uterus. °The process of labor usually starts when your baby is full-term, between 39 and 40 weeks of pregnancy. °When labor starts, or if your water breaks, call your health care provider or nurse care line. Based on your situation, they will determine when you should go in for an exam. °This information is not intended to replace advice given to you by your health care provider. Make sure you discuss any questions you have with your health care provider. °Document Revised: 04/19/2021 Document Reviewed: 04/19/2021 °Elsevier Patient Education © 2022 Elsevier Inc. ° °

## 2022-02-18 ENCOUNTER — Other Ambulatory Visit: Payer: Self-pay

## 2022-02-18 ENCOUNTER — Inpatient Hospital Stay
Admission: EM | Admit: 2022-02-18 | Discharge: 2022-02-19 | DRG: 807 | Disposition: A | Discharge status: Home / Self Care | Payer: Medicaid Other | Attending: Licensed Practical Nurse | Admitting: Licensed Practical Nurse

## 2022-02-18 ENCOUNTER — Inpatient Hospital Stay: Payer: Medicaid Other | Admitting: Anesthesiology

## 2022-02-18 ENCOUNTER — Encounter: Payer: Self-pay | Admitting: Obstetrics and Gynecology

## 2022-02-18 DIAGNOSIS — O09293 Supervision of pregnancy with other poor reproductive or obstetric history, third trimester: Secondary | ICD-10-CM

## 2022-02-18 DIAGNOSIS — F32A Depression, unspecified: Secondary | ICD-10-CM | POA: Diagnosis present

## 2022-02-18 DIAGNOSIS — O99344 Other mental disorders complicating childbirth: Secondary | ICD-10-CM | POA: Diagnosis present

## 2022-02-18 DIAGNOSIS — O9952 Diseases of the respiratory system complicating childbirth: Secondary | ICD-10-CM | POA: Diagnosis present

## 2022-02-18 DIAGNOSIS — O219 Vomiting of pregnancy, unspecified: Secondary | ICD-10-CM

## 2022-02-18 DIAGNOSIS — Z20822 Contact with and (suspected) exposure to covid-19: Secondary | ICD-10-CM | POA: Diagnosis present

## 2022-02-18 DIAGNOSIS — Z3A35 35 weeks gestation of pregnancy: Secondary | ICD-10-CM

## 2022-02-18 DIAGNOSIS — O479 False labor, unspecified: Secondary | ICD-10-CM | POA: Diagnosis present

## 2022-02-18 DIAGNOSIS — Z3403 Encounter for supervision of normal first pregnancy, third trimester: Secondary | ICD-10-CM

## 2022-02-18 DIAGNOSIS — Z23 Encounter for immunization: Secondary | ICD-10-CM

## 2022-02-18 DIAGNOSIS — Z79899 Other long term (current) drug therapy: Secondary | ICD-10-CM

## 2022-02-18 DIAGNOSIS — J45909 Unspecified asthma, uncomplicated: Secondary | ICD-10-CM | POA: Diagnosis present

## 2022-02-18 DIAGNOSIS — F419 Anxiety disorder, unspecified: Secondary | ICD-10-CM

## 2022-02-18 HISTORY — DX: Unspecified asthma, uncomplicated: J45.909

## 2022-02-18 LAB — CBC
HCT: 33 % — ABNORMAL LOW (ref 36.0–46.0)
Hemoglobin: 11.1 g/dL — ABNORMAL LOW (ref 12.0–15.0)
MCH: 29.1 pg (ref 26.0–34.0)
MCHC: 33.6 g/dL (ref 30.0–36.0)
MCV: 86.6 fL (ref 80.0–100.0)
Platelets: 316 10*3/uL (ref 150–400)
RBC: 3.81 MIL/uL — ABNORMAL LOW (ref 3.87–5.11)
RDW: 12.7 % (ref 11.5–15.5)
WBC: 12.9 10*3/uL — ABNORMAL HIGH (ref 4.0–10.5)
nRBC: 0.4 % — ABNORMAL HIGH (ref 0.0–0.2)

## 2022-02-18 LAB — TYPE AND SCREEN
ABO/RH(D): O POS
Antibody Screen: NEGATIVE

## 2022-02-18 LAB — RESP PANEL BY RT-PCR (FLU A&B, COVID) ARPGX2
Influenza A by PCR: NEGATIVE
Influenza B by PCR: NEGATIVE
SARS Coronavirus 2 by RT PCR: NEGATIVE

## 2022-02-18 LAB — ABO/RH: ABO/RH(D): O POS

## 2022-02-18 LAB — GROUP B STREP BY PCR: Group B strep by PCR: NEGATIVE

## 2022-02-18 LAB — HIV ANTIBODY (ROUTINE TESTING W REFLEX): HIV Screen 4th Generation wRfx: NONREACTIVE

## 2022-02-18 MED ORDER — ONDANSETRON HCL 4 MG PO TABS: 4.0000 mg | ORAL_TABLET | ORAL | Status: DC | PRN

## 2022-02-18 MED ORDER — SODIUM CHLORIDE 0.9 % IV SOLN
INTRAVENOUS | Status: AC
  Filled 2022-02-18: qty 5

## 2022-02-18 MED ORDER — MISOPROSTOL 200 MCG PO TABS
ORAL_TABLET | ORAL | Status: AC
  Filled 2022-02-18: qty 4

## 2022-02-18 MED ORDER — OXYTOCIN-SODIUM CHLORIDE 30-0.9 UT/500ML-% IV SOLN: 2.5000 [IU]/h | INTRAVENOUS | Status: DC

## 2022-02-18 MED ORDER — LIDOCAINE HCL (PF) 1 % IJ SOLN: 30.0000 mL | INTRAMUSCULAR | Status: DC | PRN

## 2022-02-18 MED ORDER — EPHEDRINE 5 MG/ML INJ: 10.0000 mg | INTRAVENOUS | Status: DC | PRN

## 2022-02-18 MED ORDER — LIDOCAINE-EPINEPHRINE (PF) 1.5 %-1:200000 IJ SOLN
INTRAMUSCULAR | Status: DC | PRN
Start: 2022-02-18 — End: 2022-02-18
  Administered 2022-02-18: 3 mL via EPIDURAL

## 2022-02-18 MED ORDER — LIDOCAINE HCL (PF) 1 % IJ SOLN
INTRAMUSCULAR | Status: AC
  Filled 2022-02-18: qty 30

## 2022-02-18 MED ORDER — LIDOCAINE HCL (PF) 1 % IJ SOLN
INTRAMUSCULAR | Status: DC | PRN
Start: 2022-02-18 — End: 2022-02-18
  Administered 2022-02-18: 2 mL via SUBCUTANEOUS

## 2022-02-18 MED ORDER — OXYTOCIN-SODIUM CHLORIDE 30-0.9 UT/500ML-% IV SOLN
INTRAVENOUS | Status: AC
  Filled 2022-02-18: qty 500

## 2022-02-18 MED ORDER — ACETAMINOPHEN 500 MG PO TABS
1000.0000 mg | ORAL_TABLET | Freq: Four times a day (QID) | ORAL | Status: DC
  Administered 2022-02-19 (×3): 1000 mg via ORAL
  Filled 2022-02-18 (×4): qty 2

## 2022-02-18 MED ORDER — ONDANSETRON HCL 4 MG/2ML IJ SOLN
4.0000 mg | Freq: Four times a day (QID) | INTRAMUSCULAR | Status: DC | PRN
Start: 2022-02-18 — End: 2022-02-19

## 2022-02-18 MED ORDER — ACETAMINOPHEN 500 MG PO TABS
1000.0000 mg | ORAL_TABLET | Freq: Four times a day (QID) | ORAL | Status: DC
  Administered 2022-02-18: 1000 mg via ORAL
  Filled 2022-02-18: qty 2

## 2022-02-18 MED ORDER — SIMETHICONE 80 MG PO CHEW: 80.0000 mg | CHEWABLE_TABLET | ORAL | Status: DC | PRN

## 2022-02-18 MED ORDER — WITCH HAZEL-GLYCERIN EX PADS
1.0000 "application " | MEDICATED_PAD | CUTANEOUS | Status: DC | PRN
  Filled 2022-02-18: qty 100

## 2022-02-18 MED ORDER — DIPHENHYDRAMINE HCL 25 MG PO CAPS: 25.0000 mg | ORAL_CAPSULE | Freq: Four times a day (QID) | ORAL | Status: DC | PRN

## 2022-02-18 MED ORDER — OXYTOCIN 10 UNIT/ML IJ SOLN
INTRAMUSCULAR | Status: AC
  Filled 2022-02-18: qty 2

## 2022-02-18 MED ORDER — SODIUM CHLORIDE 0.9 % IV SOLN
INTRAVENOUS | Status: DC | PRN
  Administered 2022-02-18 (×2): 5 mL via EPIDURAL

## 2022-02-18 MED ORDER — FENTANYL-BUPIVACAINE-NACL 0.5-0.125-0.9 MG/250ML-% EP SOLN
EPIDURAL | Status: AC
  Filled 2022-02-18: qty 250

## 2022-02-18 MED ORDER — LACTATED RINGERS IV SOLN: INTRAVENOUS | Status: DC

## 2022-02-18 MED ORDER — DIPHENHYDRAMINE HCL 50 MG/ML IJ SOLN: 12.5000 mg | INTRAMUSCULAR | Status: DC | PRN

## 2022-02-18 MED ORDER — ONDANSETRON HCL 4 MG/2ML IJ SOLN: 4.0000 mg | INTRAMUSCULAR | Status: DC | PRN

## 2022-02-18 MED ORDER — LACTATED RINGERS IV SOLN
500.0000 mL | Freq: Once | INTRAVENOUS | Status: AC
  Administered 2022-02-18: 500 mL via INTRAVENOUS

## 2022-02-18 MED ORDER — FENTANYL-BUPIVACAINE-NACL 0.5-0.125-0.9 MG/250ML-% EP SOLN: 12.0000 mL/h | EPIDURAL | Status: DC | PRN

## 2022-02-18 MED ORDER — PHENYLEPHRINE 40 MCG/ML (10ML) SYRINGE FOR IV PUSH (FOR BLOOD PRESSURE SUPPORT): 80.0000 ug | PREFILLED_SYRINGE | INTRAVENOUS | Status: DC | PRN

## 2022-02-18 MED ORDER — PENICILLIN G POT IN DEXTROSE 60000 UNIT/ML IV SOLN: 3.0000 10*6.[IU] | INTRAVENOUS | Status: DC

## 2022-02-18 MED ORDER — DIBUCAINE (PERIANAL) 1 % EX OINT: 1.0000 "application " | TOPICAL_OINTMENT | CUTANEOUS | Status: DC | PRN

## 2022-02-18 MED ORDER — PRENATAL MULTIVITAMIN CH
1.0000 | ORAL_TABLET | Freq: Every day | ORAL | Status: DC
  Administered 2022-02-19: 1 via ORAL
  Filled 2022-02-18: qty 1

## 2022-02-18 MED ORDER — AMMONIA AROMATIC IN INHA
RESPIRATORY_TRACT | Status: AC
  Filled 2022-02-18: qty 10

## 2022-02-18 MED ORDER — SODIUM CHLORIDE 0.9 % IV SOLN
5.0000 10*6.[IU] | Freq: Once | INTRAVENOUS | Status: AC
  Administered 2022-02-18: 5 10*6.[IU] via INTRAVENOUS

## 2022-02-18 MED ORDER — SERTRALINE HCL 50 MG PO TABS
75.0000 mg | ORAL_TABLET | Freq: Every day | ORAL | Status: DC
  Filled 2022-02-18: qty 1

## 2022-02-18 MED ORDER — SERTRALINE HCL 25 MG PO TABS
50.0000 mg | ORAL_TABLET | Freq: Every day | ORAL | Status: DC
Start: 2022-02-18 — End: 2022-02-19
  Administered 2022-02-19: 50 mg via ORAL
  Filled 2022-02-18: qty 2
  Filled 2022-02-18: qty 1

## 2022-02-18 MED ORDER — SOD CITRATE-CITRIC ACID 500-334 MG/5ML PO SOLN: 30.0000 mL | ORAL | Status: DC | PRN

## 2022-02-18 MED ORDER — DOCUSATE SODIUM 100 MG PO CAPS
100.0000 mg | ORAL_CAPSULE | Freq: Two times a day (BID) | ORAL | Status: DC
  Administered 2022-02-19 (×2): 100 mg via ORAL
  Filled 2022-02-18 (×2): qty 1

## 2022-02-18 MED ORDER — OXYTOCIN BOLUS FROM INFUSION
333.0000 mL | Freq: Once | INTRAVENOUS | Status: AC
  Administered 2022-02-18: 333 mL via INTRAVENOUS

## 2022-02-18 MED ORDER — IBUPROFEN 600 MG PO TABS
600.0000 mg | ORAL_TABLET | Freq: Four times a day (QID) | ORAL | Status: DC
  Administered 2022-02-18 – 2022-02-19 (×5): 600 mg via ORAL
  Filled 2022-02-18 (×5): qty 1

## 2022-02-18 MED ORDER — ZOLPIDEM TARTRATE 5 MG PO TABS: 5.0000 mg | ORAL_TABLET | Freq: Every evening | ORAL | Status: DC | PRN

## 2022-02-18 MED ORDER — COCONUT OIL OIL
1.0000 "application " | TOPICAL_OIL | Status: DC | PRN
  Filled 2022-02-18: qty 120

## 2022-02-18 MED ORDER — FENTANYL-BUPIVACAINE-NACL 0.5-0.125-0.9 MG/250ML-% EP SOLN
EPIDURAL | Status: DC | PRN
  Administered 2022-02-18: 12 mL/h via EPIDURAL

## 2022-02-18 MED ORDER — LACTATED RINGERS IV SOLN: 500.0000 mL | INTRAVENOUS | Status: DC | PRN

## 2022-02-18 MED ORDER — BUTORPHANOL TARTRATE 1 MG/ML IJ SOLN: 1.0000 mg | INTRAMUSCULAR | Status: DC | PRN
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
## 2022-02-18 NOTE — Anesthesia Procedure Notes (Signed)
Epidural ?Patient location during procedure: OB ?Start time: 02/18/2022 9:43 AM ?End time: 02/18/2022 9:48 AM ? ?Staffing ?Anesthesiologist: Martha Clan, MD ?Performed: anesthesiologist  ? ?Preanesthetic Checklist ?Completed: patient identified, IV checked, site marked, risks and benefits discussed, surgical consent, monitors and equipment checked, pre-op evaluation and timeout performed ? ?Epidural ?Patient position: sitting ?Prep: ChloraPrep ?Patient monitoring: heart rate, continuous pulse ox and blood pressure ?Approach: midline ?Location: L3-L4 ?Injection technique: LOR saline ? ?Needle:  ?Needle type: Tuohy  ?Needle gauge: 17 G ?Needle length: 9 cm and 9 ?Needle insertion depth: 5 cm ?Catheter type: closed end flexible ?Catheter size: 19 Gauge ?Catheter at skin depth: 10 cm ?Test dose: negative and 1.5% lidocaine with Epi 1:200 K ? ?Assessment ?Sensory level: T10 ?Events: blood not aspirated, injection not painful, no injection resistance, no paresthesia and negative IV test ? ?Additional Notes ?1st attempt ?Pt. Evaluated and documentation done after procedure finished. ?Patient identified. Risks/Benefits/Options discussed with patient including but not limited to bleeding, infection, nerve damage, paralysis, failed block, incomplete pain control, headache, blood pressure changes, nausea, vomiting, reactions to medication both or allergic, itching and postpartum back pain. Confirmed with bedside nurse the patient's most recent platelet count. Confirmed with patient that they are not currently taking any anticoagulation, have any bleeding history or any family history of bleeding disorders. Patient expressed understanding and wished to proceed. All questions were answered. Sterile technique was used throughout the entire procedure. Please see nursing notes for vital signs. Test dose was given through epidural catheter and negative prior to continuing to dose epidural or start infusion. Warning signs of high  block given to the patient including shortness of breath, tingling/numbness in hands, complete motor block, or any concerning symptoms with instructions to call for help. Patient was given instructions on fall risk and not to get out of bed. All questions and concerns addressed with instructions to call with any issues or inadequate analgesia.   ? ?Patient tolerated the insertion well without immediate complications.Reason for block:procedure for pain ? ? ? ?

## 2022-02-18 NOTE — Discharge Summary (Signed)
Obstetrical Discharge Summary ? ?Date of Admission: 02/18/2022 ?Date of Discharge: 02/19/2022 ? ?Primary OB: [facility] ? ?Gestational Age at Delivery: [redacted]w[redacted]d  ? ?Antepartum complications: treated for trich in first trimester, IPV, Depression  ?Reason for Admission: preterm labor ?Date of Delivery: 02/18/2022 at 1428  ?Delivered By: Siri Cole, CNM  ?Delivery Type: spontaneous vaginal delivery ?Intrapartum complications/course: Preterm Labor ?Anesthesia: epidural ?Placenta: Delivered and expressed via active management. Intact: yes. To pathology: no.  ?Laceration: none ?Episiotomy: none ?EBL: ?Baby: Liveborn female, APGARs 7/9, weight 2800 g.  ?  ?Discharge Diagnosis: Delivered.  ? ?Postpartum course: Doing well. Ambulating and voiding without difficulty. Bleeding WNL. Having minimal pain. Breastfeeding independently.  ?Discharge Vital Signs: ? Current Vital Signs 24h Vital Sign Ranges  ?T 97.8 ?F (36.6 ?C) Temp  Avg: 98 ?F (36.7 ?C)  Min: 97.5 ?F (36.4 ?C)  Max: 98.3 ?F (36.8 ?C)  ?BP 124/65 BP  Min: 111/85  Max: 129/84  ?HR 67 Pulse  Avg: 65.6  Min: 56  Max: 72  ?RR 20 Resp  Avg: 18.8  Min: 16  Max: 20  ?SaO2 95 % (Room Air) Room Air SpO2  Avg: 97.9 %  Min: 95 %  Max: 99 %  ?    ? 24 Hour I/O Current Shift I/O  ?Time ?Ins ?Outs 03/04 0701 - 03/05 0700 ?In: 710.4 [I.V.:710.4] ?Out: 975 [Urine:750] No intake/output data recorded.  ? ? ? ? ? ?Discharge Exam:  ?NAD ?Breasts: soft, no redness or masses, nipples erect and intact bilaterally.  ?Perineum: slight;ly edematous  ?Abdomen: firm fundus below the umbilicus, NTTP, non distended, +bowel sounds.   ?RRR no MRGs ?CTAB ?Ext: trace edema ? ?Recent Labs  ?Lab 02/18/22 ?5732 02/19/22 ?2025  ?WBC 12.9* 10.9*  ?HGB 11.1* 9.8*  ?HCT 33.0* 30.1*  ?PLT 316 271  ? ? ?Disposition: Home ? ?Rh Immune globulin given: no ?Rubella vaccine given: no ?Tdap vaccine given in AP or PP setting: yes ?Flu vaccine given in AP or PP setting: given at discharge  ? ?Contraception:  Depo on  discharge  ? ?Prenatal/Postnatal Panel: O POS ?Performed at [facility], [address] [place], [place] [address] ?Ishmael Holter Immune//Varicella Immune//RPR negative//HIV negative/HepB Surface Ag negative//pap never collected due to age  plans to breastfeed ? ?Plan:  ?Danielle Hammond was discharged to home in good condition. ?Follow-up appointment with LMD in 2 and 6 weeks for a Mood check and PP  visit ? ?Future Appointments  ?Date Time Provider Department Center  ?02/22/2022  9:35 AM Tresea Mall, CNM [facility] None  ?03/03/2022  2:55 PM Mehlani Blankenburg, Courtney Heys, CNM [facility] None  ? ? ?Discharge Medications: ?Allergies as of 02/19/2022   ? ?   Reactions  ? Procaine Swelling  ? Facial swelling  ? Procaine Hcl Swelling  ? Facial swelling  ? ?  ? ?  ?Medication List  ?  ? ?TAKE these medications   ? ?acetaminophen 500 MG tablet ?Commonly known as: TYLENOL ?Take 2 tablets (1,000 mg total) by mouth every 6 (six) hours. ?  ?albuterol 108 (90 Base) MCG/ACT inhaler ?Commonly known as: VENTOLIN HFA ?Inhale 2 puffs into the lungs every 6 (six) hours as needed. For wheezing ?  ?coconut oil Oil ?Apply 1 application topically as needed. ?  ?dibucaine 1 % Oint ?Commonly known as: NUPERCAINAL ?Place 1 application rectally as needed for hemorrhoids. ?  ?fluticasone 50 MCG/ACT nasal spray ?Commonly known as: Flonase ?Place 2 sprays into both nostrils daily. ?  ?ibuprofen 600 MG tablet ?  Commonly known as: ADVIL ?Take 1 tablet (600 mg total) by mouth every 6 (six) hours. ?  ?prenatal multivitamin Tabs tablet ?Take 1 tablet by mouth daily at 12 noon. ?What changed: medication strength ?  ?sertraline 50 MG tablet ?Commonly known as: Zoloft ?Take 2 tablets (100 mg total) by mouth daily. ?  ?simethicone 80 MG chewable tablet ?Commonly known as: MYLICON ?Chew 1 tablet (80 mg total) by mouth as needed for flatulence. ?  ?witch hazel-glycerin pad ?Commonly known as: TUCKS ?Apply 1 application topically as needed for hemorrhoids. ?  ? ?   ?Carie Caddy, CNM  ?[facility], [facility]  ?@TODAY @  ?9:14 AM  ? ?

## 2022-02-18 NOTE — Anesthesia Preprocedure Evaluation (Signed)
Anesthesia Evaluation  ?Patient identified by MRN, date of birth, ID band ?Patient awake ? ? ? ?Reviewed: ?Allergy & Precautions, H&P , NPO status , Patient's Chart, lab work & pertinent test results, reviewed documented beta blocker date and time  ? ?History of Anesthesia Complications ?Negative for: history of anesthetic complications ? ?Airway ?Mallampati: II ? ?TM Distance: >3 FB ?Neck ROM: full ? ? ? Dental ?no notable dental hx. ? ?  ?Pulmonary ?neg shortness of breath, asthma , neg sleep apnea, neg COPD, neg recent URI,  ?  ?Pulmonary exam normal ?breath sounds clear to auscultation ? ? ? ? ? ? Cardiovascular ?Exercise Tolerance: Good ?negative cardio ROS ?Normal cardiovascular exam ?Rhythm:regular Rate:Normal ? ? ?  ?Neuro/Psych ?PSYCHIATRIC DISORDERS Anxiety Depression negative neurological ROS ?   ? GI/Hepatic ?Neg liver ROS, GERD  ,  ?Endo/Other  ?negative endocrine ROS ? Renal/GU ?negative Renal ROS  ?negative genitourinary ?  ?Musculoskeletal ? ? Abdominal ?  ?Peds ? Hematology ?negative hematology ROS ?(+)   ?Anesthesia Other Findings ?Past Medical History: ?No date: Acne ?No date: Allergic rhinitis ?No date: Asthma ?No date: Dysmenorrhea in adolescent ?No date: Epistaxis ?No date: Preterm labor ? ? Reproductive/Obstetrics ?negative OB ROS ? ?  ? ? ? ? ? ? ? ? ? ? ? ? ? ?  ?  ? ? ? ? ? ? ? ? ?Anesthesia Physical ?Anesthesia Plan ? ?ASA: 2 ? ?Anesthesia Plan: Epidural  ? ?Post-op Pain Management:   ? ?Induction:  ? ?PONV Risk Score and Plan:  ? ?Airway Management Planned:  ? ?Additional Equipment:  ? ?Intra-op Plan:  ? ?Post-operative Plan:  ? ?Informed Consent: I have reviewed the patients History and Physical, chart, labs and discussed the procedure including the risks, benefits and alternatives for the proposed anesthesia with the patient or authorized representative who has indicated his/her understanding and acceptance.  ? ? ? ?Dental Advisory Given ? ?Plan  Discussed with: Anesthesiologist, CRNA and Surgeon ? ?Anesthesia Plan Comments:   ? ? ? ? ? ? ?Anesthesia Quick Evaluation ? ?

## 2022-02-18 NOTE — Plan of Care (Signed)
?  Problem: Education: ?Goal: Knowledge of General Education information will improve ?Description: Including pain rating scale, medication(s)/side effects and non-pharmacologic comfort measures ?Outcome: Progressing ?  ?Problem: Activity: ?Goal: Will verbalize the importance of balancing activity with adequate rest periods ?Outcome: Progressing ?  ?Problem: Life Cycle: ?Goal: Chance of risk for complications during the postpartum period will decrease ?Outcome: Progressing ?  ?

## 2022-02-19 LAB — CBC
HCT: 30.1 % — ABNORMAL LOW (ref 36.0–46.0)
Hemoglobin: 9.8 g/dL — ABNORMAL LOW (ref 12.0–15.0)
MCH: 29.1 pg (ref 26.0–34.0)
MCHC: 32.6 g/dL (ref 30.0–36.0)
MCV: 89.3 fL (ref 80.0–100.0)
Platelets: 271 10*3/uL (ref 150–400)
RBC: 3.37 MIL/uL — ABNORMAL LOW (ref 3.87–5.11)
RDW: 12.8 % (ref 11.5–15.5)
WBC: 10.9 10*3/uL — ABNORMAL HIGH (ref 4.0–10.5)
nRBC: 0.3 % — ABNORMAL HIGH (ref 0.0–0.2)

## 2022-02-19 LAB — RPR: RPR Ser Ql: NONREACTIVE

## 2022-02-19 MED ORDER — MEDROXYPROGESTERONE ACETATE 150 MG/ML IM SUSP
150.0000 mg | Freq: Once | INTRAMUSCULAR | Status: AC
  Administered 2022-02-19: 150 mg via INTRAMUSCULAR
  Filled 2022-02-19: qty 1

## 2022-02-19 MED ORDER — ACETAMINOPHEN 500 MG PO TABS: 1000.0000 mg | ORAL_TABLET | Freq: Four times a day (QID) | ORAL | 0 refills | Status: DC

## 2022-02-19 MED ORDER — PRENATAL MULTIVITAMIN CH
1.0000 | ORAL_TABLET | Freq: Every day | ORAL | Status: DC
Start: 2022-02-19 — End: 2022-05-12

## 2022-02-19 MED ORDER — COCONUT OIL OIL: 1.0000 "application " | TOPICAL_OIL | 0 refills | Status: DC | PRN

## 2022-02-19 MED ORDER — INFLUENZA VAC SPLIT QUAD 0.5 ML IM SUSY
0.5000 mL | PREFILLED_SYRINGE | INTRAMUSCULAR | Status: AC | PRN
  Administered 2022-02-19: 0.5 mL via INTRAMUSCULAR
  Filled 2022-02-19: qty 0.5

## 2022-02-19 MED ORDER — DIBUCAINE (PERIANAL) 1 % EX OINT: 1.0000 "application " | TOPICAL_OINTMENT | CUTANEOUS | Status: DC | PRN

## 2022-02-19 MED ORDER — IBUPROFEN 600 MG PO TABS: 600.0000 mg | ORAL_TABLET | Freq: Four times a day (QID) | ORAL | 0 refills | Status: DC

## 2022-02-19 MED ORDER — SIMETHICONE 80 MG PO CHEW: 80.0000 mg | CHEWABLE_TABLET | ORAL | 0 refills | Status: DC | PRN

## 2022-02-19 MED ORDER — WITCH HAZEL-GLYCERIN EX PADS: 1.0000 "application " | MEDICATED_PAD | CUTANEOUS | 12 refills | Status: DC | PRN

## 2022-02-19 NOTE — Progress Notes (Signed)
Patient discharged home with infant. Discharge instructions and prescriptions given and reviewed with patient. Enforced the importance of follow-up care. Patient verbalized understanding. Escorted out by staff.   ?

## 2022-02-19 NOTE — Discharge Instructions (Signed)
Discharge Instructions:  ? ?Follow-up Appointment: Call [facility] to schedule a follow-up appointment in 2 weeks.  ? ?If there are any new medications, they have been ordered and will be available for pickup at the listed pharmacy on your way home from the hospital.  ? ?Call office if you have any of the following: headache, visual changes, fever >101.0 F, chills, shortness of breath, breast concerns, excessive vaginal bleeding, incision drainage or problems, leg pain or redness, depression or any other concerns. If you have vaginal discharge with an odor, let your doctor know.  ? ?It is normal to bleed for up to 6 weeks. You should not soak through more than 1 pad in 1 hour. If you have a blood clot larger than your fist with continued bleeding, call your doctor.  ? ?Activity: Do not lift > 10 lbs for 6 weeks (do not lift anything heavier than your baby). ?No intercourse, tampons, swimming pools, hot tubs, baths (only showers) for 6 weeks.  ?No driving for 1-2 weeks. ?Continue prenatal vitamin, especially if breastfeeding. ?Increase calories and fluids (water) while breastfeeding.  ? ?Your milk will come in, in the next couple of days (right now it is colostrum). You may have a slight fever when your milk comes in, but it should go away on its own.  If it does not, and rises above 101 F please call the doctor. You will also feel achy and your breasts will be firm. They will also start to leak. If you are breastfeeding, continue as you have been and you can pump/express milk for comfort.  ? ?If you have too much milk, your breasts can become engorged, which could lead to mastitis. This is an infection of the milk ducts. It can be very painful and you will need to notify your doctor to obtain a prescription for antibiotics. You can also treat it with a shower or hot/cold compress.  ? ?For concerns about your baby, please call your pediatrician.  ?For breastfeeding concerns, the lactation consultant can be  reached at [phone].  ? ?Postpartum blues (feelings of happy one minute and sad another minute) are normal for the first few weeks but if it gets worse let your doctor know.  ? ?Congratulations! We enjoyed caring for you and your new bundle of joy!   ?

## 2022-02-19 NOTE — Anesthesia Postprocedure Evaluation (Signed)
Anesthesia Post Note ? ?Patient: Danielle Hammond ? ?Procedure(s) Performed: AN AD HOC LABOR EPIDURAL ? ?Patient location during evaluation: Mother Baby ?Anesthesia Type: Epidural ?Level of consciousness: awake and alert ?Pain management: pain level controlled ?Vital Signs Assessment: post-procedure vital signs reviewed and stable ?Respiratory status: spontaneous breathing, nonlabored ventilation and respiratory function stable ?Cardiovascular status: stable ?Postop Assessment: no headache, no backache and epidural receding ?Anesthetic complications: no ? ? ?No notable events documented. ? ? ?Last Vitals:  ?Vitals:  ? 02/19/22 0357 02/19/22 0812  ?BP: 115/83 124/65  ?Pulse: (!) 57 67  ?Resp:  20  ?Temp: 36.7 ?C 36.6 ?C  ?SpO2: 98% 95%  ?  ?Last Pain:  ?Vitals:  ? 02/19/22 0845  ?TempSrc:   ?PainSc: 0-No pain  ? ? ?  ?  ?  ?  ?  ?  ? ?Corinda Gubler ? ? ? ? ?

## 2022-02-22 ENCOUNTER — Encounter: Payer: Medicaid Other | Admitting: Advanced Practice Midwife

## 2022-03-03 ENCOUNTER — Encounter: Payer: Medicaid Other | Admitting: Licensed Practical Nurse

## 2022-03-06 ENCOUNTER — Other Ambulatory Visit: Payer: Self-pay

## 2022-03-06 ENCOUNTER — Ambulatory Visit (INDEPENDENT_AMBULATORY_CARE_PROVIDER_SITE_OTHER): Payer: Medicaid Other | Admitting: Licensed Practical Nurse

## 2022-03-06 DIAGNOSIS — F418 Other specified anxiety disorders: Secondary | ICD-10-CM | POA: Diagnosis not present

## 2022-03-06 DIAGNOSIS — O99345 Other mental disorders complicating the puerperium: Secondary | ICD-10-CM | POA: Diagnosis not present

## 2022-03-06 DIAGNOSIS — Z1332 Encounter for screening for maternal depression: Secondary | ICD-10-CM

## 2022-03-06 MED ORDER — BUSPIRONE HCL 5 MG PO TABS: 5.0000 mg | ORAL_TABLET | Freq: Two times a day (BID) | ORAL | 0 refills | Status: DC
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
## 2022-04-05 ENCOUNTER — Encounter: Payer: Self-pay | Admitting: Licensed Practical Nurse

## 2022-04-05 ENCOUNTER — Other Ambulatory Visit (HOSPITAL_COMMUNITY)
Admission: RE | Admit: 2022-04-05 | Discharge: 2022-04-05 | Disposition: A | Discharge status: Home / Self Care | Payer: Medicaid Other | Source: Ambulatory Visit | Attending: Licensed Practical Nurse | Admitting: Licensed Practical Nurse

## 2022-04-05 ENCOUNTER — Ambulatory Visit (INDEPENDENT_AMBULATORY_CARE_PROVIDER_SITE_OTHER): Payer: Medicaid Other | Admitting: Licensed Practical Nurse

## 2022-04-05 DIAGNOSIS — Z124 Encounter for screening for malignant neoplasm of cervix: Secondary | ICD-10-CM

## 2022-04-05 DIAGNOSIS — Z113 Encounter for screening for infections with a predominantly sexual mode of transmission: Secondary | ICD-10-CM | POA: Diagnosis present

## 2022-04-05 DIAGNOSIS — O99345 Other mental disorders complicating the puerperium: Secondary | ICD-10-CM

## 2022-04-05 DIAGNOSIS — J45909 Unspecified asthma, uncomplicated: Secondary | ICD-10-CM

## 2022-04-05 DIAGNOSIS — O99519 Diseases of the respiratory system complicating pregnancy, unspecified trimester: Secondary | ICD-10-CM

## 2022-04-05 DIAGNOSIS — Z3402 Encounter for supervision of normal first pregnancy, second trimester: Secondary | ICD-10-CM

## 2022-04-05 DIAGNOSIS — F418 Other specified anxiety disorders: Secondary | ICD-10-CM

## 2022-04-05 DIAGNOSIS — N393 Stress incontinence (female) (male): Secondary | ICD-10-CM

## 2022-04-05 DIAGNOSIS — K649 Unspecified hemorrhoids: Secondary | ICD-10-CM

## 2022-04-05 DIAGNOSIS — J339 Nasal polyp, unspecified: Secondary | ICD-10-CM

## 2022-04-05 MED ORDER — ALBUTEROL SULFATE HFA 108 (90 BASE) MCG/ACT IN AERS: 2.0000 | INHALATION_SPRAY | Freq: Four times a day (QID) | RESPIRATORY_TRACT | 5 refills | Status: DC | PRN

## 2022-04-05 MED ORDER — FLUTICASONE PROPIONATE 50 MCG/ACT NA SUSP: 2.0000 | Freq: Every day | NASAL | 3 refills | Status: AC

## 2022-04-05 MED ORDER — HYDROCORTISONE ACETATE 25 MG RE SUPP: 25.0000 mg | Freq: Two times a day (BID) | RECTAL | 1 refills | Status: DC

## 2022-04-05 MED ORDER — BUSPIRONE HCL 5 MG PO TABS: 5.0000 mg | ORAL_TABLET | Freq: Two times a day (BID) | ORAL | 2 refills | Status: DC
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
## 2022-04-06 ENCOUNTER — Other Ambulatory Visit: Payer: Self-pay | Admitting: Licensed Practical Nurse

## 2022-04-06 DIAGNOSIS — K649 Unspecified hemorrhoids: Secondary | ICD-10-CM

## 2022-04-06 LAB — HEPATITIS C ANTIBODY: Hep C Virus Ab: NONREACTIVE

## 2022-04-06 LAB — HEP, RPR, HIV PANEL
HIV Screen 4th Generation wRfx: NONREACTIVE
Hepatitis B Surface Ag: NEGATIVE
RPR Ser Ql: NONREACTIVE

## 2022-04-11 ENCOUNTER — Other Ambulatory Visit: Payer: Self-pay | Admitting: Licensed Practical Nurse

## 2022-04-11 LAB — CYTOLOGY - PAP
Chlamydia: NEGATIVE
Comment: NEGATIVE
Comment: NEGATIVE
Comment: NORMAL
Diagnosis: NEGATIVE
Neisseria Gonorrhea: NEGATIVE
Trichomonas: NEGATIVE

## 2022-04-11 NOTE — Progress Notes (Signed)
Prescription hemorrhoid suppository not covered by insurance. Call to [facility], she continues to have itching near her rectum, she sometimes can feel the hemorrhoids near her rectum.  Will try OTC suppositories and soaking in the  tub twice a day. ?Carie Caddy, CNM  ?Domingo Pulse, [facility]  ?04/11/22  ?11:48 AM  ? ?

## 2022-05-12 ENCOUNTER — Other Ambulatory Visit: Payer: Self-pay

## 2022-05-12 ENCOUNTER — Ambulatory Visit: Payer: Medicaid Other | Admitting: Nurse Practitioner

## 2022-05-12 ENCOUNTER — Encounter: Payer: Self-pay | Admitting: Nurse Practitioner

## 2022-05-12 VITALS — BP 110/78 | HR 100 | Temp 98.5°F | Resp 18 | Ht 65.0 in | Wt 155.7 lb

## 2022-05-12 DIAGNOSIS — Z7689 Persons encountering health services in other specified circumstances: Secondary | ICD-10-CM

## 2022-05-12 DIAGNOSIS — D6489 Other specified anemias: Secondary | ICD-10-CM

## 2022-05-12 DIAGNOSIS — H539 Unspecified visual disturbance: Secondary | ICD-10-CM

## 2022-05-12 NOTE — Progress Notes (Signed)
BP 110/78   Pulse 100   Temp 98.5 F (36.9 C) (Oral)   Resp 18   Ht 5\' 5"  (1.651 m)   Wt 155 lb 11.2 oz (70.6 kg)   SpO2 97%   BMI 25.91 kg/m    Subjective:    Patient ID: Danielle Hammond, female    DOB: Feb 01, 2001, 21 y.o.   MRN: [id number]  HPI: Danielle Hammond is a 21 y.o. female  Chief Complaint  Patient presents with   Establish Care   Eye Problem    Vision changes since having a baby   Establish care: Last physical was last month at [facility]. She says everything was normal.  Recently had a baby girl.  Vaginal delivery no complications. She does say that she is a little anemic.  She is going back to see GYN next week to start Depo shot.    Blurred vision:  She says that she has noticed that her vision has been blurry and worsened after having a baby. She says she has never had to wear glasses before.  Will refer her to eye specialist.  Vision Screening   Right eye Left eye Both eyes  Without correction 20/30 20/50 20/25   With correction       anemia: She recently had a baby.  Her hgb/hct were 9.8 and 30.1 on 02/19/2022. She is currently taking prenatal vitamins with iron in it. She has follow up appointment with GYN next week.   Depression/anxiety: Currently taking buspar 5 mg once a day and zoloft 50 mg daily.  She says she has been doing well with her mental health.  She denies any suicidal thoughts.  Continue with current treatment plan.    05/12/2022   10:22 AM  Depression screen PHQ 2/9  Decreased Interest 0  Down, Depressed, Hopeless 1  PHQ - 2 Score 1  Altered sleeping 0  Tired, decreased energy 0  Change in appetite 0  Feeling bad or failure about yourself  0  Trouble concentrating 0  Moving slowly or fidgety/restless 0  Suicidal thoughts 0  PHQ-9 Score 1  Difficult doing work/chores Not difficult at all       05/12/2022   10:22 AM  GAD 7 : Generalized Anxiety Score  Nervous, Anxious, on Edge 1  Control/stop worrying 0  Worry too much - different  things 0  Trouble relaxing 0  Restless 0  Easily annoyed or irritable 1  Afraid - awful might happen 2  Total GAD 7 Score 4  Anxiety Difficulty Somewhat difficult     Relevant past medical, surgical, family and social history reviewed and updated as indicated. Interim medical history since our last visit reviewed. Allergies and medications reviewed and updated.  Review of Systems  Constitutional: Negative for fever or weight change.  Respiratory: Negative for cough and shortness of breath.   Cardiovascular: Negative for chest pain or palpitations.  Gastrointestinal: Negative for abdominal pain, no bowel changes.  Musculoskeletal: Negative for gait problem or joint swelling.  Skin: Negative for rash.  Neurological: Negative for dizziness or headache.  No other specific complaints in a complete review of systems (except as listed in HPI above).      Objective:    BP 110/78   Pulse 100   Temp 98.5 F (36.9 C) (Oral)   Resp 18   Ht 5\' 5"  (1.651 m)   Wt 155 lb 11.2 oz (70.6 kg)   SpO2 97%   BMI 25.91 kg/m   Wt Readings  from Last 3 Encounters:  05/12/22 155 lb 11.2 oz (70.6 kg)  04/05/22 151 lb (68.5 kg)  03/06/22 148 lb (67.1 kg)    Physical Exam  Constitutional: Patient appears well-developed and well-nourished.  No distress.  HEENT: head atraumatic, normocephalic, pupils equal and reactive to light, neck supple Cardiovascular: Normal rate, regular rhythm and normal heart sounds.  No murmur heard. No BLE edema. Pulmonary/Chest: Effort normal and breath sounds normal. No respiratory distress. Abdominal: Soft.  There is no tenderness. Psychiatric: Patient has a normal mood and affect. behavior is normal. Judgment and thought content normal.   Results for orders placed or performed in visit on 04/05/22  HEP, RPR, HIV Panel  Result Value Ref Range   Hepatitis B Surface Ag Negative Negative   RPR Ser Ql Non Reactive Non Reactive   HIV Screen 4th Generation wRfx Non  Reactive Non Reactive  Hepatitis C antibody  Result Value Ref Range   Hep C Virus Ab Non Reactive Non Reactive  Cytology - PAP  Result Value Ref Range   Neisseria Gonorrhea Negative    Chlamydia Negative    Trichomonas Negative    Adequacy      Satisfactory for evaluation; transformation zone component PRESENT.   Diagnosis      - Negative for intraepithelial lesion or malignancy (NILM)   Comment Normal Reference Range Trichomonas - Negative    Comment Normal Reference Ranger Chlamydia - Negative    Comment      Normal Reference Range Neisseria Gonorrhea - Negative      Assessment & Plan:   Problem List Items Addressed This Visit   None Visit Diagnoses     Changes in vision    -  Primary   Referral placed for optometry.  States she had noticed changes in her vision prior to having the baby but worsened after delivery.  Blood pressure is normal.   Relevant Orders   Ambulatory referral to Optometry   Encounter to establish care       Patient states she recently had a physical last month.  She says everything was basically normal.   Anemia due to other cause, not classified       She recently had a baby.  She is currently taking prenatal vitamins with iron.  She has a follow-up with GYN next week.        Follow up plan: Return if symptoms worsen or fail to improve.

## 2022-05-17 ENCOUNTER — Ambulatory Visit: Payer: Medicaid Other

## 2022-05-17 ENCOUNTER — Other Ambulatory Visit: Payer: Self-pay | Admitting: Licensed Practical Nurse

## 2022-05-17 DIAGNOSIS — Z308 Encounter for other contraceptive management: Secondary | ICD-10-CM

## 2022-05-17 MED ORDER — MEDROXYPROGESTERONE ACETATE 150 MG/ML IM SUSP: 150.0000 mg | INTRAMUSCULAR | 3 refills | Status: DC

## 2022-05-18 ENCOUNTER — Ambulatory Visit (INDEPENDENT_AMBULATORY_CARE_PROVIDER_SITE_OTHER): Payer: Medicaid Other

## 2022-05-18 DIAGNOSIS — Z3042 Encounter for surveillance of injectable contraceptive: Secondary | ICD-10-CM | POA: Diagnosis not present

## 2022-05-18 MED ORDER — MEDROXYPROGESTERONE ACETATE 150 MG/ML IM SUSP
150.0000 mg | Freq: Once | INTRAMUSCULAR | Status: AC
  Administered 2022-05-18: 150 mg via INTRAMUSCULAR

## 2022-05-25 ENCOUNTER — Encounter: Payer: Self-pay | Admitting: Nurse Practitioner

## 2022-05-25 ENCOUNTER — Encounter: Payer: Self-pay | Admitting: Licensed Practical Nurse

## 2022-06-09 IMAGING — US US OB COMP +14 WK
1 series · 15 of 28 positions shown · non-contrast
Comparison: none

CLINICAL DATA: Second trimester pregnancy for fetal anatomy survey.

EXAM:
OBSTETRICAL ULTRASOUND >14 WKS

[Series 1: us ob comp + 14 wk · 15 of 67 slices shown]
[im 1/67]
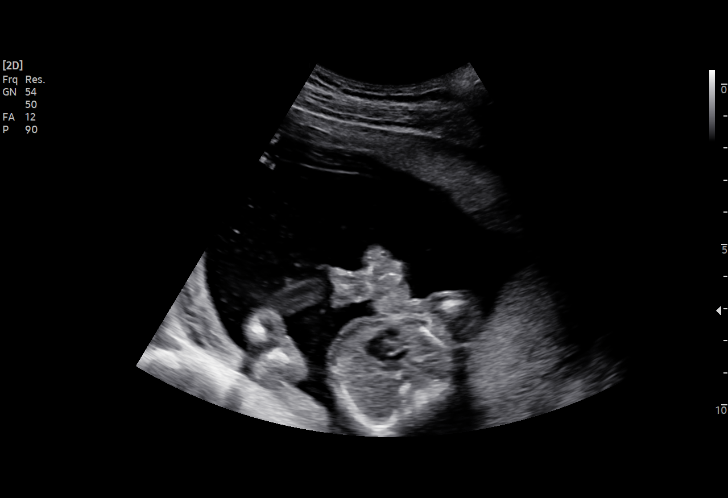
[im 5/67]
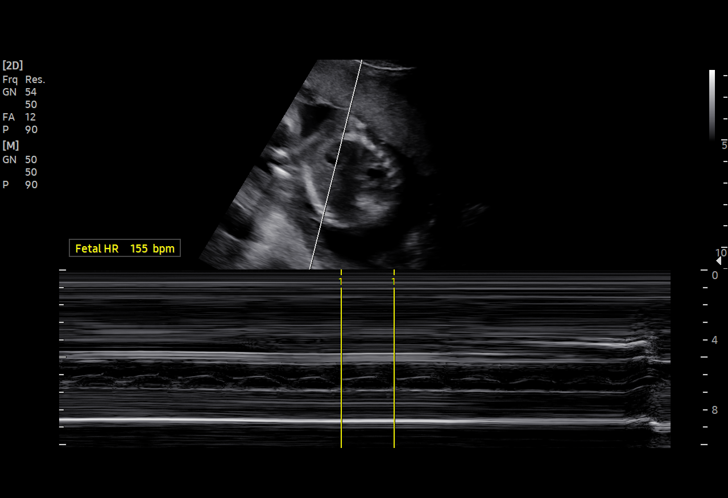
[im 10/67]
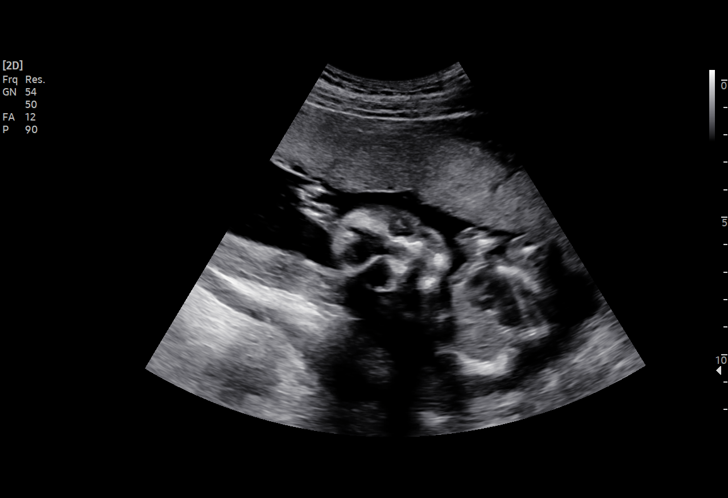
[im 15/67]
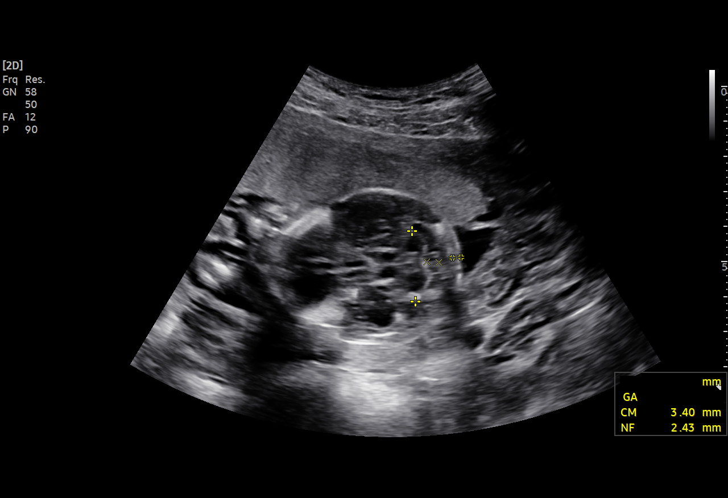
[im 20/67]
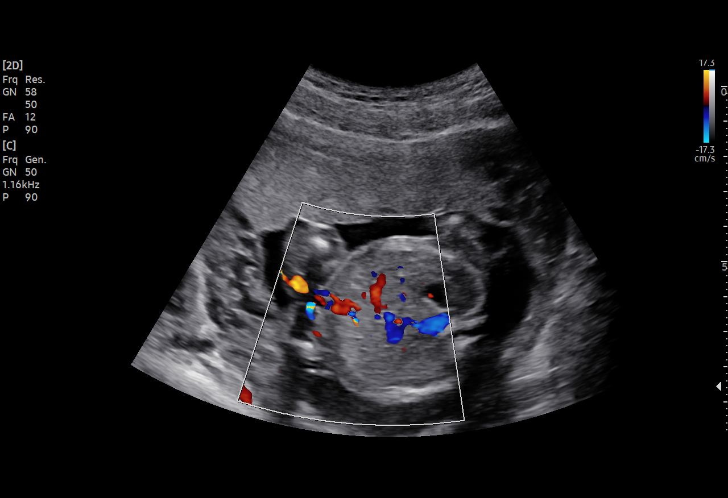
[im 25/67]
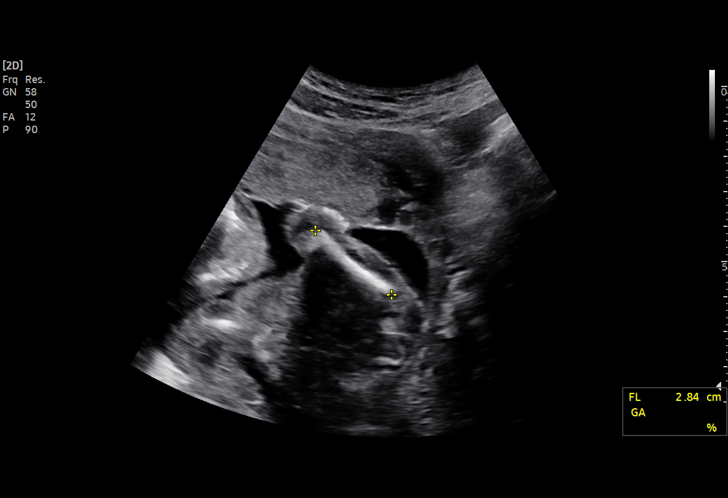
[im 30/67]
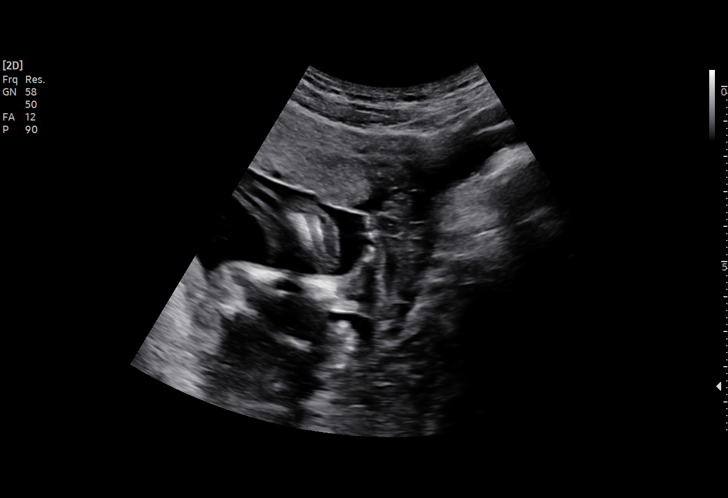
[im 35/67]
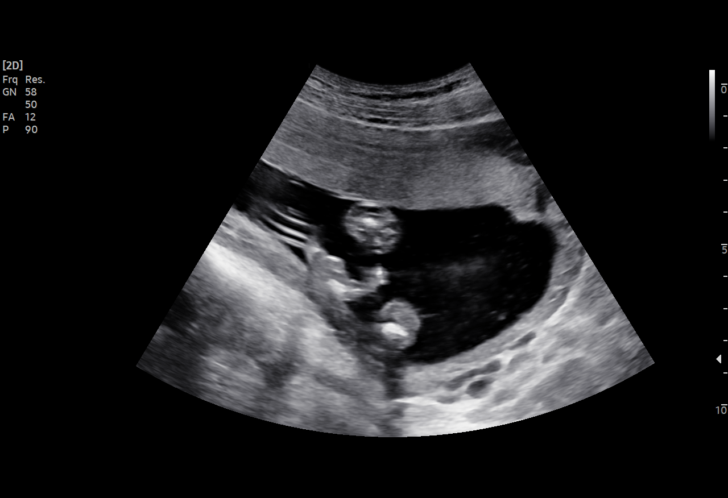
[im 37/67]
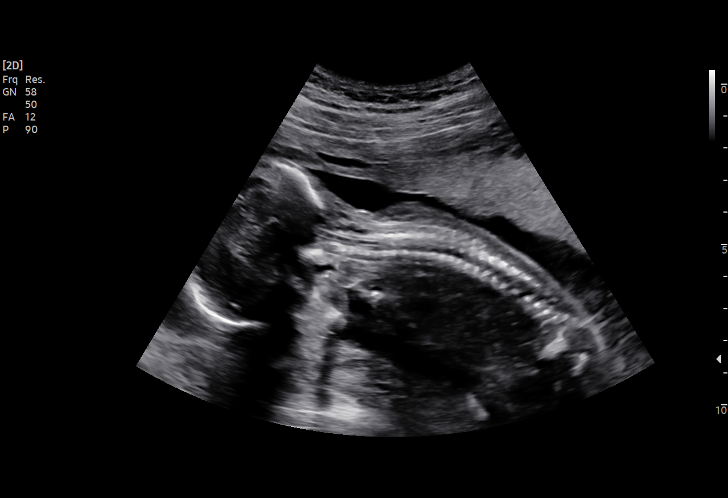
[im 42/67]
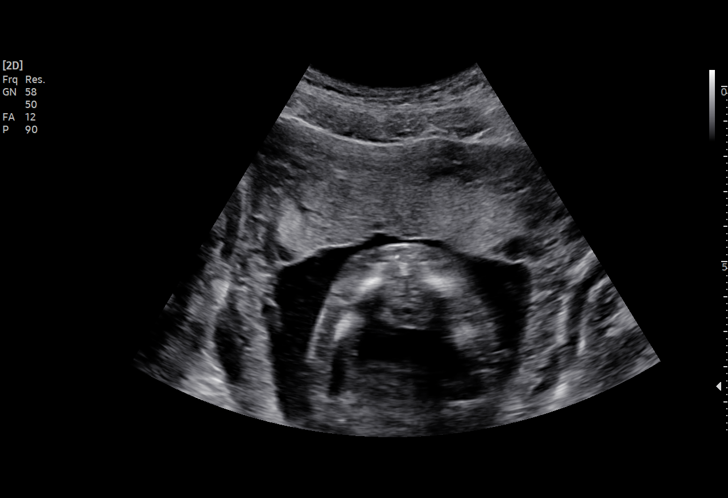
[im 47/67]
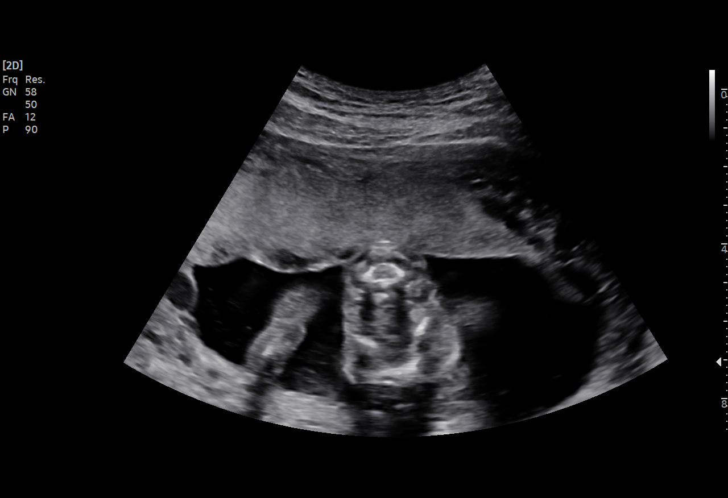
[im 52/67]
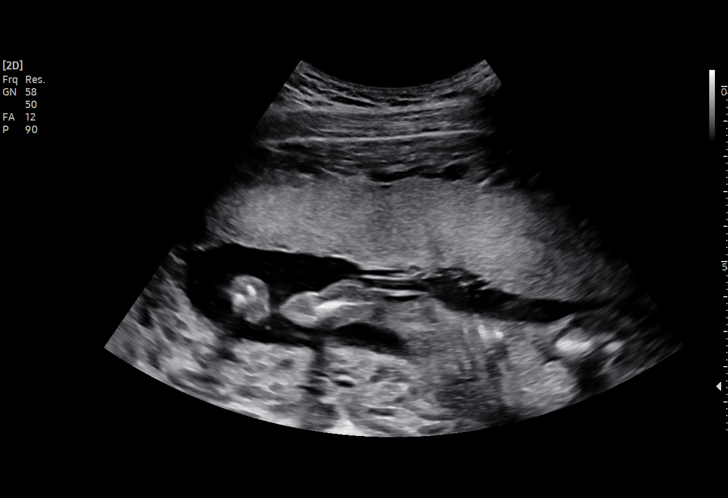
[im 57/67]
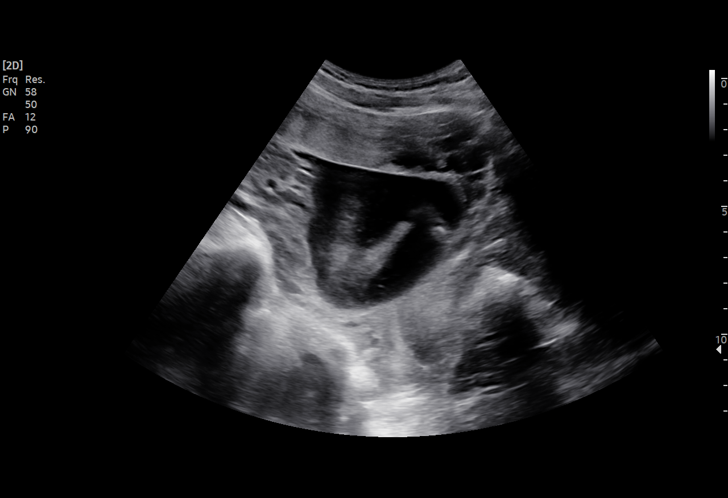
[im 62/67]
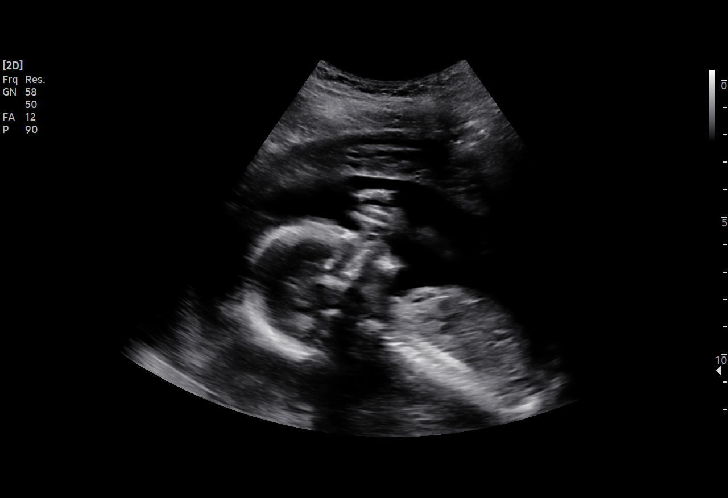
[im 67/67]
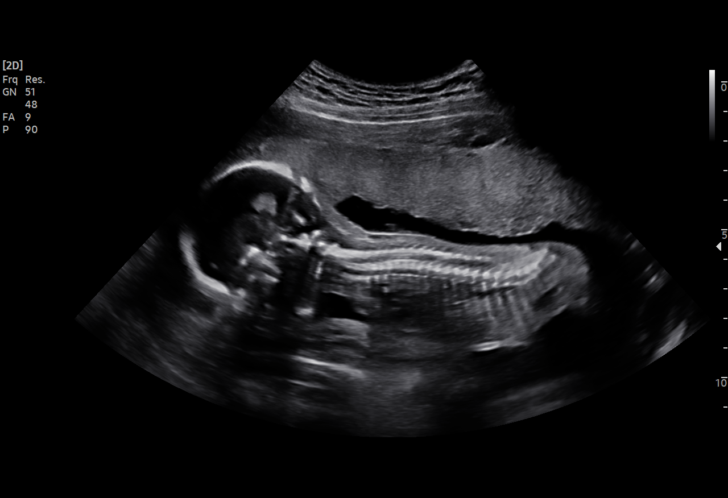

[15 of 28 positions shown; findings below may reference images not displayed]

FINDINGS: Number of Fetuses: 1

Heart Rate:  155 bpm

Movement: Yes

Presentation: Breech

Previa: No

Placental Location: Anterior

Amniotic Fluid (Subjective): Within normal limits

Amniotic Fluid (Objective):

Vertical pocket = 5.0cm

FETAL BIOMETRY

BPD: 4.3cm 19w 0d

HC:   16.2cm 19w 0d

AC:   14.0cm 19w 3d

FL:   2.8cm 18w 5d

Current Mean GA: 19w 1d US EDC: 03/19/2022

FETAL ANATOMY

Lateral Ventricles: Appears normal

Thalami/CSP: Appears normal

Posterior Fossa:  Appears normal

Nuchal Region: Appears normal   NFT= 2.4 mm

Upper Lip: Appears normal

Spine: Appears normal

4 Chamber Heart on Left: Appears normal

LVOT: Not visualized

RVOT: Not visualized

Stomach on Left: Appears normal

3 Vessel Cord: Appears normal

Cord Insertion site: Appears normal

Kidneys: Appears normal

Bladder: Appears normal

Extremities: Appears normal

Technically difficult due to: Fetal position

Maternal Findings:

Cervix:  3.3 cm TA
IMPRESSION: Single living IUP with estimated gestational age of 19 weeks 1 day,
and US EDC of 03/19/2022.

No fetal anomalies identified, although cardiac outflow tracts could
not be visualized. Consider followup ultrasound in 3-4 weeks to
complete anatomic evaluation.

## 2022-06-15 ENCOUNTER — Ambulatory Visit: Payer: Self-pay | Admitting: *Deleted

## 2022-06-15 NOTE — Telephone Encounter (Signed)
  Chief Complaint: abdominal cramping and pain, vaginal bleeding  since giving birth 02/18/22 Symptoms: low abdominal pain cramping at times becoming more constant and called out of work today. Nausea, bloated, vaginal bleeding not heavy. Incontinent of urine at times. S/p gave birth 02/18/22. Back pain at times . Frequency: after March 4 Pertinent Negatives: Patient denies chest pain, no difficulty breathing no fever, no dizziness or lightheadedness.  Disposition: [] ED /[] Urgent Care (no appt availability in office) / [x] Appointment(In office/virtual)/ []  [facility]/ [] Home Care/ [] Refused Recommended Disposition /[] [facility]/ []  Follow-up with PCP Additional Notes:   Na   Reason for Disposition  [1] MODERATE pain (e.g., interferes with normal activities) AND [2] pain comes and goes (cramps) AND [3] present > 24 hours  (Exception: pain with Vomiting or Diarrhea - see that Guideline)  Answer Assessment - Initial Assessment Questions 1. LOCATION: "Where does it hurt?"      Lower abdomen 2. RADIATION: "Does the pain shoot anywhere else?" (e.g., chest, back)     Back  3. ONSET: "When did the pain begin?" (e.g., minutes, hours or days ago)      Soon after giving birth 4 months ago and progressively worse 4. SUDDEN: "Gradual or sudden onset?"     Gradual  5. PATTERN "Does the pain come and go, or is it constant?"    - If constant: "Is it getting better, staying the same, or worsening?"      (Note: Constant means the pain never goes away completely; most serious pain is constant and it progresses)     - If intermittent: "How long does it last?" "Do you have pain now?"     (Note: Intermittent means the pain goes away completely between bouts)    Was coming and going now becoming more constant 6. SEVERITY: "How bad is the pain?"  (e.g., Scale 1-10; mild, moderate, or severe)   - MILD (1-3): doesn't interfere with normal activities, abdomen soft and not tender to touch    -  MODERATE (4-7): interferes with normal activities or awakens from sleep, abdomen tender to touch    - SEVERE (8-10): excruciating pain, doubled over, unable to do any normal activities      Mild to moderate. Had to leave work today  vaginal bleeding since having baby 02/18/22 7. RECURRENT SYMPTOM: "Have you ever had this type of stomach pain before?" If Yes, ask: "When was the last time?" and "What happened that time?"      na 8. CAUSE: "What do you think is causing the stomach pain?"     Not sure  9. RELIEVING/AGGRAVATING FACTORS: "What makes it better or worse?" (e.g., movement, antacids, bowel movement)     Resting is better , tylenol not effective  10. OTHER SYMPTOMS: "Do you have any other symptoms?" (e.g., back pain, diarrhea, fever, urination pain, vomiting)       Incontinent at times of urine .  Vaginal bleeding not heavy. Back pain. Bloated feeling, cramping like menstrual pain 11. PREGNANCY: "Is there any chance you are pregnant?" "When was your last menstrual period?"       02/18/22 delivered baby and now bleeding since giving birth  Protocols used: Abdominal Pain - [facility]

## 2022-06-16 ENCOUNTER — Ambulatory Visit: Payer: Medicaid Other | Admitting: Physician Assistant

## 2022-06-16 ENCOUNTER — Encounter: Payer: Self-pay | Admitting: Physician Assistant

## 2022-06-16 VITALS — BP 118/80 | HR 115 | Temp 98.0°F | Resp 16 | Ht 65.0 in | Wt 155.3 lb

## 2022-06-16 DIAGNOSIS — D6489 Other specified anemias: Secondary | ICD-10-CM

## 2022-06-16 DIAGNOSIS — N939 Abnormal uterine and vaginal bleeding, unspecified: Secondary | ICD-10-CM

## 2022-06-16 NOTE — Progress Notes (Signed)
Established Patient Office Visit  Name: Danielle Hammond   MRN: [id number]    DOB: 03/24/2001   Date:06/16/2022  Today's Provider: Jacquelin Hawking, MHS, PA-C Introduced myself to the patient as a PA-C and provided education on APPs in clinical practice.         Subjective  Chief Complaint  Chief Complaint  Patient presents with   Abdominal Pain    Hypogastric area onset for 2 moths ago she relates them to cramps.   Vaginal Bleeding    Pt has been seen previously at OB/GYN wa told she is fine. Pt states she has been bleeding since she gave birth    HPI  Reports she is concerned for continuous bleeding since giving birth in March States she is cramping and having back pains almost every day Reports she is having spotting everyday- usually red in color.   States she had to call off work yesterday due to cramping Reports she is getting Depo injections  She has taken 2 pregnancy tests- one last night and one this AM, both negative  Denies changes to vaginal discharge  Reports pain with intercourse  Denies dysuria  Tried to reach out to [facility] office about this but was not able to get an apt.     Patient Active Problem List   Diagnosis Date Noted   Other specified anemias 06/16/2022   Vaginal bleeding problems 06/16/2022   Uterine contractions 02/18/2022   Nausea and vomiting during pregnancy 08/31/2021   Anxiety and depression 08/31/2021   Supervision of normal pregnancy 08/18/2021    Past Surgical History:  Procedure Laterality Date   NO PAST SURGERIES      Family History  Problem Relation Age of Onset   Breast cancer Neg Hx    Ovarian cancer Neg Hx     Social History   Tobacco Use   Smoking status: Never   Smokeless tobacco: Never  Substance Use Topics   Alcohol use: Never     Current Outpatient Medications:    albuterol (VENTOLIN HFA) 108 (90 Base) MCG/ACT inhaler, Inhale 2 puffs into the lungs every 6 (six) hours as needed. For wheezing, Disp:  1 each, Rfl: 5   busPIRone (BUSPAR) 5 MG tablet, Take 1 tablet (5 mg total) by mouth 2 (two) times daily., Disp: 60 tablet, Rfl: 2   fluticasone (FLONASE) 50 MCG/ACT nasal spray, Place 2 sprays into both nostrils daily., Disp: 16 g, Rfl: 3   hydrocortisone (ANUSOL-HC) 25 MG suppository, Place 1 suppository (25 mg total) rectally 2 (two) times daily., Disp: 12 suppository, Rfl: 1   medroxyPROGESTERone (DEPO-PROVERA) 150 MG/ML injection, Inject 1 mL (150 mg total) into the muscle every 3 (three) months., Disp: 1 mL, Rfl: 3   sertraline (ZOLOFT) 50 MG tablet, Take 2 tablets (100 mg total) by mouth daily., Disp: 60 tablet, Rfl: 1  Allergies  Allergen Reactions   Procaine Swelling    Facial swelling   Procaine Hcl Swelling    Facial swelling    I personally reviewed active problem list, medication list, allergies, notes from last encounter, lab results with the patient/caregiver today.   Review of Systems  Constitutional:  Negative for chills and fever.  Respiratory:  Negative for cough, shortness of breath and wheezing.   Cardiovascular:  Negative for chest pain, palpitations and leg swelling.  Genitourinary:  Negative for dysuria.       Vaginal bleeding and cramping Reports pain with intercourse  Neurological:  Negative for dizziness, weakness and headaches.      Objective  Vitals:   06/16/22 1304  BP: 118/80  Pulse: (!) 115  Resp: 16  Temp: 98 F (36.7 C)  TempSrc: Oral  SpO2: 98%  Weight: 155 lb 4.8 oz (70.4 kg)  Height: 5\' 5"  (1.651 m)    Body mass index is 25.84 kg/m.  Physical Exam Vitals reviewed.  Constitutional:      General: She is awake.     Appearance: Normal appearance. She is well-developed, well-groomed and normal weight.  HENT:     Head: Normocephalic and atraumatic.  Pulmonary:     Effort: Pulmonary effort is normal.  Abdominal:     General: Abdomen is flat.     Palpations: Abdomen is soft.  Musculoskeletal:     Cervical back: Normal range of  motion.  Neurological:     General: No focal deficit present.     Mental Status: She is alert and oriented to person, place, and time.     GCS: GCS eye subscore is 4. GCS verbal subscore is 5. GCS motor subscore is 6.     Cranial Nerves: No dysarthria or facial asymmetry.     Motor: No weakness, tremor or abnormal muscle tone.     Coordination: Coordination is intact.     Gait: Gait is intact.  Psychiatric:        Attention and Perception: Attention and perception normal.        Mood and Affect: Mood and affect normal.        Speech: Speech normal.        Behavior: Behavior normal. Behavior is cooperative.      Recent Results (from the past 2160 hour(s))  Cytology - PAP     Status: None   Collection Time: 04/05/22  2:53 PM  Result Value Ref Range   Neisseria Gonorrhea Negative    Chlamydia Negative    Trichomonas Negative    Adequacy      Satisfactory for evaluation; transformation zone component PRESENT.   Diagnosis      - Negative for intraepithelial lesion or malignancy (NILM)   Comment Normal Reference Range Trichomonas - Negative    Comment Normal Reference Ranger Chlamydia - Negative    Comment      Normal Reference Range Neisseria Gonorrhea - Negative  HEP, RPR, HIV Panel     Status: None   Collection Time: 04/05/22  2:57 PM  Result Value Ref Range   Hepatitis B Surface Ag Negative Negative   RPR Ser Ql Non Reactive Non Reactive   HIV Screen 4th Generation wRfx Non Reactive Non Reactive    Comment: HIV Negative HIV-1/HIV-2 antibodies and HIV-1 p24 antigen were NOT detected. There is no laboratory evidence of HIV infection.   Hepatitis C antibody     Status: None   Collection Time: 04/05/22  2:57 PM  Result Value Ref Range   Hep C Virus Ab Non Reactive Non Reactive    Comment: HCV antibody alone does not differentiate between previously resolved infection and active infection. Equivocal and Reactive HCV antibody results should be followed up with an HCV RNA  test to support the diagnosis of active HCV infection.      PHQ2/9:    06/16/2022    1:02 PM 05/12/2022   10:22 AM  Depression screen PHQ 2/9  Decreased Interest 1 0  Down, Depressed, Hopeless 0 1  PHQ - 2 Score 1 1  Altered sleeping 0 0  Tired, decreased energy 1 0  Change in appetite 1 0  Feeling bad or failure about yourself  1 0  Trouble concentrating 1 0  Moving slowly or fidgety/restless 0 0  Suicidal thoughts 0 0  PHQ-9 Score 5 1  Difficult doing work/chores Somewhat difficult Not difficult at all      Fall Risk:    06/16/2022    1:02 PM 05/12/2022   10:22 AM  Fall Risk   Falls in the past year? 0 0  Number falls in past yr: 0 0  Injury with Fall? 0 0  Risk for fall due to : No Fall Risks   Follow up Falls prevention discussed;Education provided Falls evaluation completed      Functional Status Survey: Is the patient deaf or have difficulty hearing?: No Does the patient have difficulty seeing, even when wearing glasses/contacts?: No Does the patient have difficulty concentrating, remembering, or making decisions?: No Does the patient have difficulty walking or climbing stairs?: No Does the patient have difficulty dressing or bathing?: No Does the patient have difficulty doing errands alone such as visiting a doctor's office or shopping?: No    Assessment & Plan  Problem List Items Addressed This Visit       Other   Other specified anemias   Relevant Orders   CBC w/Diff/Platelet   Vaginal bleeding problems - Primary    Ongoing Reports she has been spotting and bleeding since giving birth in March Has normal pelvic exam/ PAP smear in April and has started on Depo with OBGYN service Suspect she may be having continued spotting from Depo use. Will check labs today for potential anemia Recommend continued monitoring and discussion on other birth control methods in effort to reduce symptoms.  Follow up as needed      Relevant Orders   CBC  w/Diff/Platelet   Iron, TIBC and Ferritin Panel   COMPLETE METABOLIC PANEL WITH GFR   TSH   FSH/LH     No follow-ups on file.   I, Sharlette Jansma E Tayra Dawe, PA-C, have reviewed all documentation for this visit. The documentation on 06/16/22 for the exam, diagnosis, procedures, and orders are all accurate and complete.   Jacquelin Hawking, MHS, PA-C [facility]

## 2022-06-16 NOTE — Assessment & Plan Note (Signed)
Ongoing Reports she has been spotting and bleeding since giving birth in March Has normal pelvic exam/ PAP smear in April and has started on Depo with OBGYN service Suspect she may be having continued spotting from Depo use. Will check labs today for potential anemia Recommend continued monitoring and discussion on other birth control methods in effort to reduce symptoms.  Follow up as needed

## 2022-06-16 NOTE — Patient Instructions (Signed)
We will keep you updated with the results from your labs as they are available  Let us know if you would like to change your birth control method to something else and we will see how we can assist you.  Let us know if the bleeding gets worse, you feel faint, have trouble breathing or have weakness.

## 2022-06-17 LAB — COMPLETE METABOLIC PANEL WITH GFR
AG Ratio: 1.8 (calc) (ref 1.0–2.5)
ALT: 16 U/L (ref 6–29)
AST: 18 U/L (ref 10–30)
Albumin: 4.9 g/dL (ref 3.6–5.1)
Alkaline phosphatase (APISO): 74 U/L (ref 31–125)
BUN: 15 mg/dL (ref 7–25)
CO2: 25 mmol/L (ref 20–32)
Calcium: 9.9 mg/dL (ref 8.6–10.2)
Chloride: 105 mmol/L (ref 98–110)
Creat: 0.93 mg/dL (ref 0.50–0.96)
Globulin: 2.7 g/dL (calc) (ref 1.9–3.7)
Glucose, Bld: 89 mg/dL (ref 65–99)
Potassium: 4.3 mmol/L (ref 3.5–5.3)
Sodium: 141 mmol/L (ref 135–146)
Total Bilirubin: 0.5 mg/dL (ref 0.2–1.2)
Total Protein: 7.6 g/dL (ref 6.1–8.1)
eGFR: 90 mL/min/{1.73_m2} (ref >=60)

## 2022-06-17 LAB — CBC WITH DIFFERENTIAL/PLATELET
Absolute Monocytes: 562 cells/uL (ref 200–950)
Basophils Absolute: 77 cells/uL (ref 0–200)
Basophils Relative: 1 %
Eosinophils Absolute: 316 cells/uL (ref 15–500)
Eosinophils Relative: 4.1 %
HCT: 41.4 % (ref 35.0–45.0)
Hemoglobin: 13.4 g/dL (ref 11.7–15.5)
Lymphs Abs: 3157 cells/uL (ref 850–3900)
MCH: 28.5 pg (ref 27.0–33.0)
MCHC: 32.4 g/dL (ref 32.0–36.0)
MCV: 87.9 fL (ref 80.0–100.0)
MPV: 12.5 fL (ref 7.5–12.5)
Monocytes Relative: 7.3 %
Neutro Abs: 3588 cells/uL (ref 1500–7800)
Neutrophils Relative %: 46.6 %
Platelets: 351 10*3/uL (ref 140–400)
RBC: 4.71 10*6/uL (ref 3.80–5.10)
RDW: 13.1 % (ref 11.0–15.0)
Total Lymphocyte: 41 %
WBC: 7.7 10*3/uL (ref 3.8–10.8)

## 2022-06-17 LAB — FSH/LH
FSH: 6 m[IU]/mL
LH: 1.1 m[IU]/mL

## 2022-06-17 LAB — IRON,TIBC AND FERRITIN PANEL
%SAT: 27 % (calc) (ref 16–45)
Ferritin: 14 ng/mL — ABNORMAL LOW (ref 16–154)
Iron: 101 ug/dL (ref 40–190)
TIBC: 368 mcg/dL (calc) (ref 250–450)

## 2022-06-17 LAB — TSH: TSH: 2.51 mIU/L

## 2022-06-28 ENCOUNTER — Ambulatory Visit: Payer: Medicaid Other | Admitting: Licensed Practical Nurse

## 2022-06-29 ENCOUNTER — Ambulatory Visit: Payer: Medicaid Other | Admitting: Nurse Practitioner

## 2022-07-04 IMAGING — US US OB FOLLOW-UP
1 series · 15 of 28 positions shown · non-contrast
Comparison: 10/24/2021

CLINICAL DATA: Follow-up anatomy evaluation.

EXAM:
OBSTETRIC 14+ WK ULTRASOUND FOLLOW-UP

[Series 1: us ob follow up · 15 of 42 slices shown]
[im 1/42]
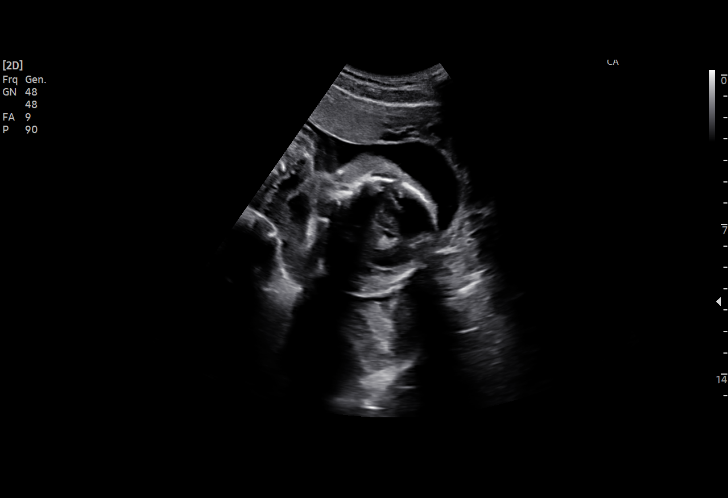
[im 4/42]
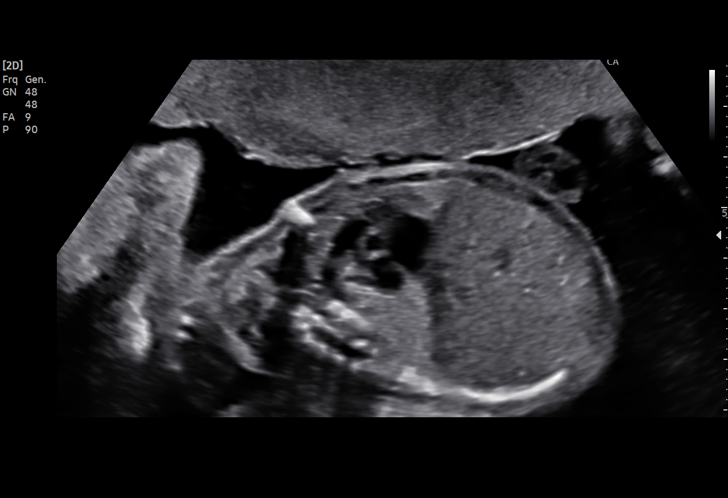
[im 7/42]
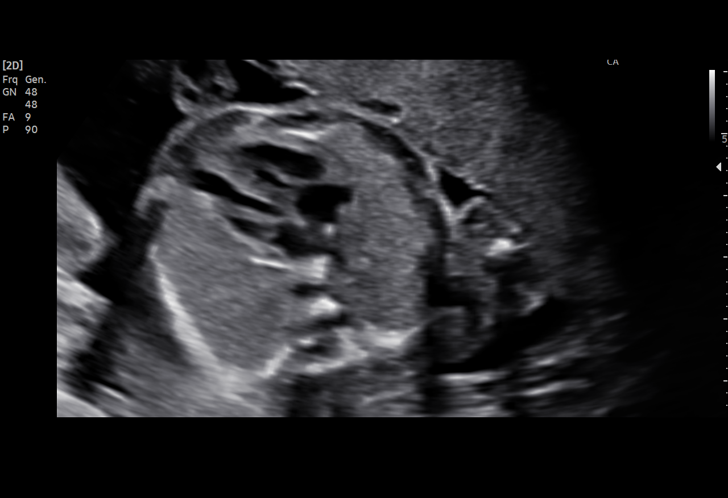
[im 10/42]
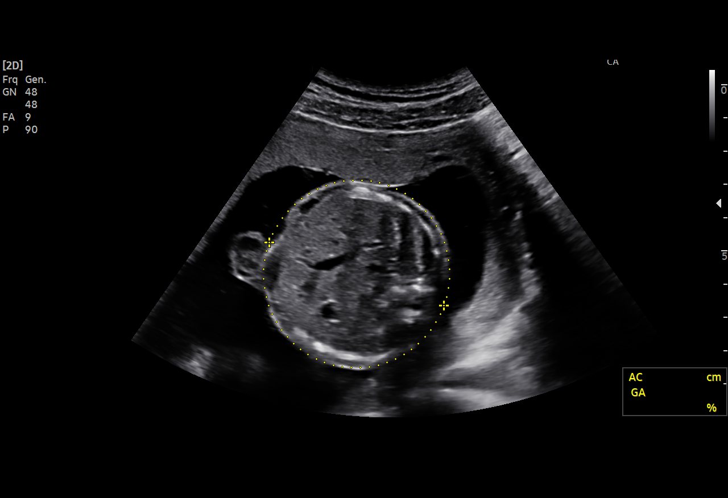
[im 13/42]
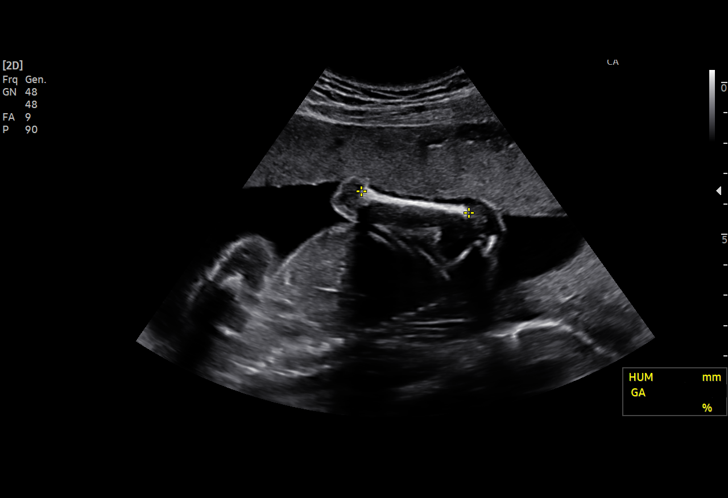
[im 16/42]
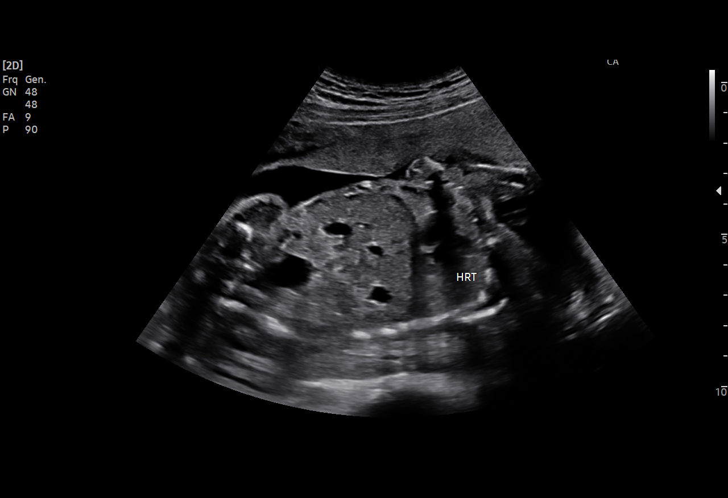
[im 19/42]
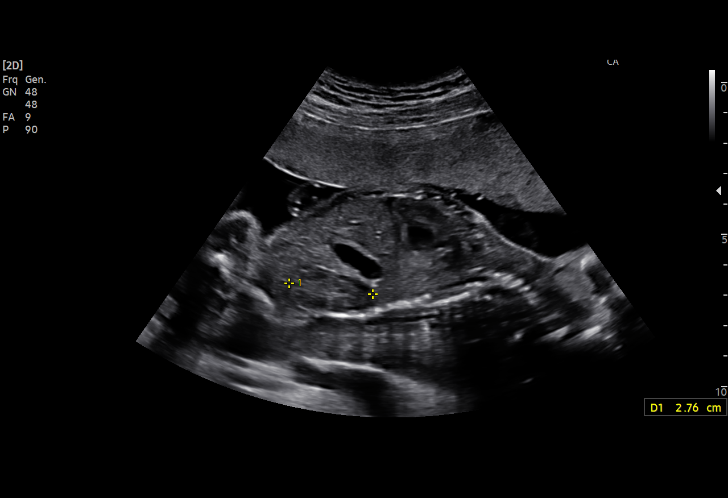
[im 22/42]
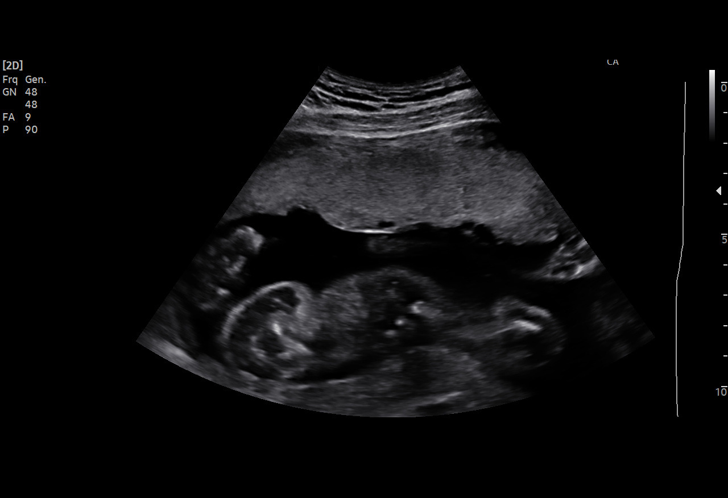
[im 23/42]
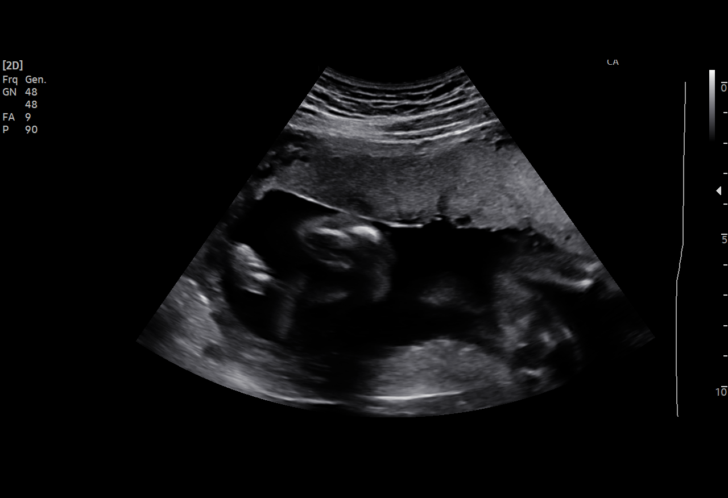
[im 26/42]
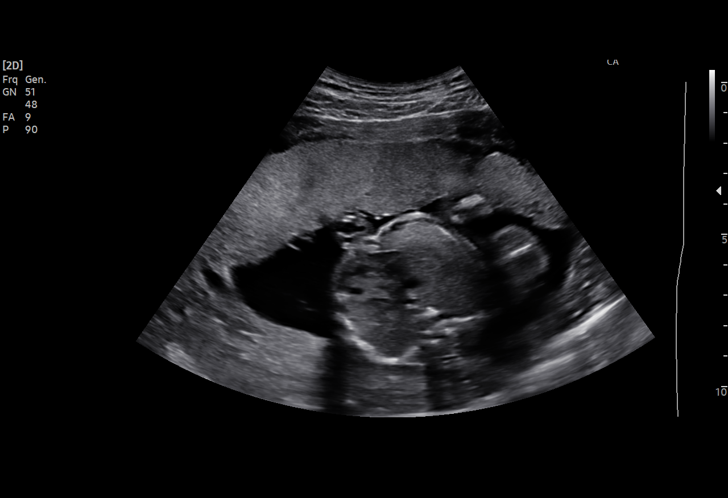
[im 29/42]
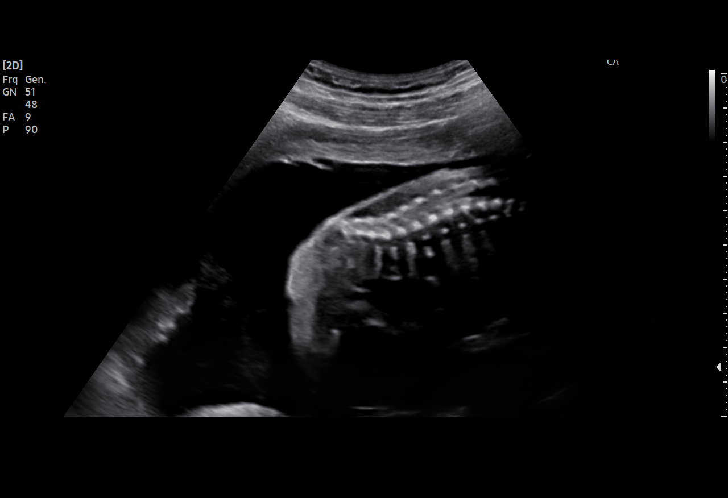
[im 32/42]
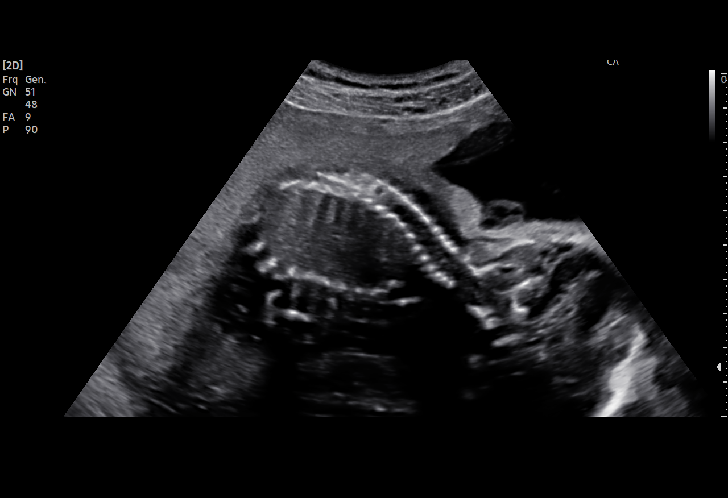
[im 35/42]
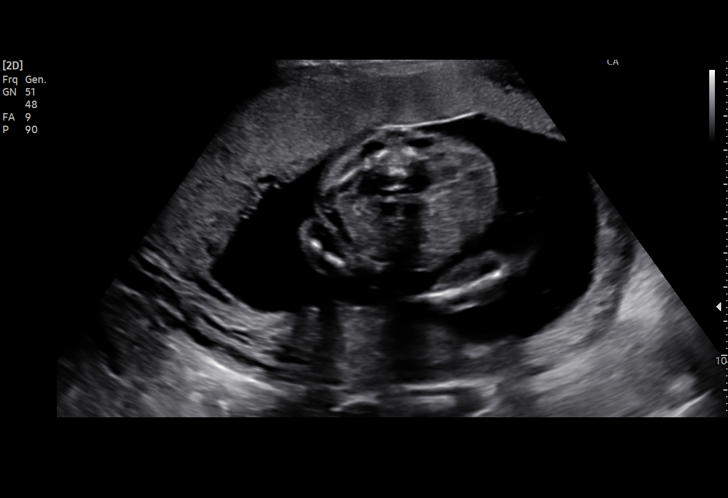
[im 38/42]
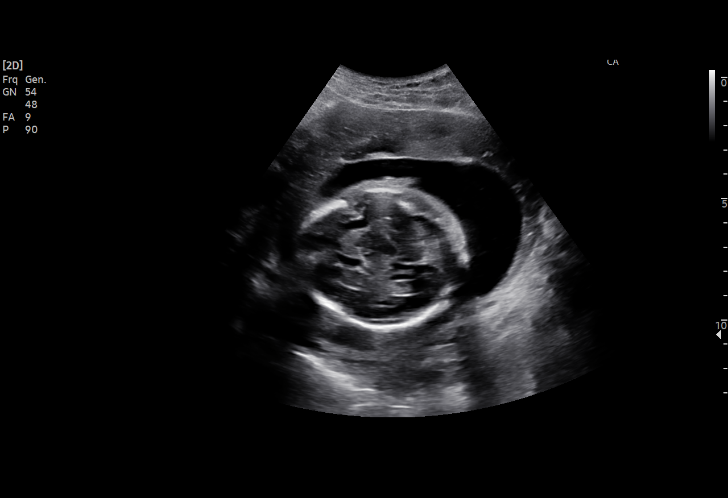
[im 42/42]
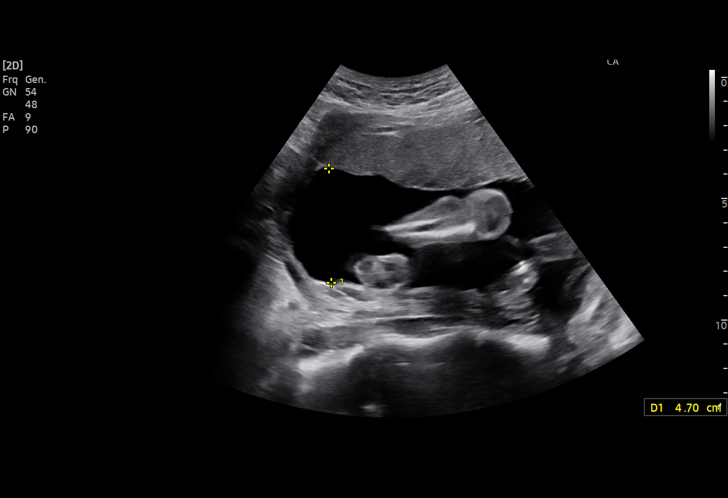

[15 of 28 positions shown; findings below may reference images not displayed]

FINDINGS: Number of Fetuses: 1

Heart Rate:  144 bpm

Movement: Yes

Presentation: Cephalic

Previa: No

Placental Location: Anterior

Amniotic Fluid (Subjective): Normal

Amniotic Fluid (Objective):

Vertical pocket 4.7cm

FETAL BIOMETRY

BPD:  5.6cm 23w 0d

HC:    20.7cm 22w 5d

AC:    17.6cm 22w 3d

FL:    4cm 22w 5d

Current Mean GA: 22w 5d              US EDC: 03/19/2022

Assigned GA: 22w 4d Assigned EDC: 03/20/2022

FETAL ANATOMY

Lateral Ventricles: Appears normal

Spine: Appears normal

4 Chamber Heart on Left: Appears normal

LVOT: Appears normal

RVOT: Appears normal

Stomach on Left: Appears normal

Kidneys: Appears normal

Bladder: Appears normal

Technical Limitations: Fetal position
IMPRESSION: 1. Single live intrauterine pregnancy as detailed above.
2. Limited anatomy evaluation demonstrates no focal fetal anatomic
abnormality.

## 2022-07-06 ENCOUNTER — Ambulatory Visit: Payer: Medicaid Other | Admitting: Licensed Practical Nurse

## 2022-07-27 ENCOUNTER — Encounter: Payer: Self-pay | Admitting: Licensed Practical Nurse

## 2022-07-27 ENCOUNTER — Ambulatory Visit: Payer: Medicaid Other | Admitting: Licensed Practical Nurse

## 2022-07-27 VITALS — BP 118/70 | Ht 65.0 in

## 2022-07-27 DIAGNOSIS — Z79899 Other long term (current) drug therapy: Secondary | ICD-10-CM | POA: Diagnosis not present

## 2022-07-27 NOTE — Progress Notes (Signed)
SUBJECTIVE: Here with partner and baby  for medication follow up.  Was started on Zoloft in pregnancy for Depression and then added Buspar PP. Reports she weaned herself off of both meds. She last took them 2 weeks ago. States "I just don't like taking medication and am not sure it was helping".  Reports she and her partner have moved into together in the [place] area, previously they were living with family Danielle Hammond reports her mood has been good, partner in agreement. She has not established care with a therapist, she was going to but once she starting work she "has had no time".  Danielle Hammond was started on Depo for birth control, she has had 2 doses. Reports she feels like she never stopped bleeding from birth. She wonders if she should switch her birth control   OBJECTIVE: BP 118/70   Ht 5\' 5"  (1.651 m)   LMP 07/25/2022   BMI 25.84 kg/m  PHQ-9 1 GAD-7 0  GEN: NAD, well groomed, speaking coherently   ASSESSMENT: Depression/Anxiety-currently stable  PLAN: Ok to remain off of Zoloft and Buspar, encouraged pt to establish care with a therapist, get regular exercise and to monitor symptoms. Please call if  you begin to develop symptoms again.  Will try one more dose of Depo (due next week).  May consider switching to another form of birth control  Danielle Hammond only nursing at night, combined method may be suitable.    09/24/2022, CNM  Carie Caddy, [facility]  07/27/22  11:52 AM

## 2022-08-03 ENCOUNTER — Ambulatory Visit: Payer: Medicaid Other

## 2022-08-08 ENCOUNTER — Ambulatory Visit (INDEPENDENT_AMBULATORY_CARE_PROVIDER_SITE_OTHER): Payer: Medicaid Other

## 2022-08-08 VITALS — BP 100/68 | HR 81 | Ht 65.0 in | Wt 159.0 lb

## 2022-08-08 DIAGNOSIS — Z3042 Encounter for surveillance of injectable contraceptive: Secondary | ICD-10-CM | POA: Diagnosis not present

## 2022-08-08 MED ORDER — MEDROXYPROGESTERONE ACETATE 150 MG/ML IM SUSP
150.0000 mg | Freq: Once | INTRAMUSCULAR | Status: AC
  Administered 2022-08-08: 150 mg via INTRAMUSCULAR

## 2022-08-08 NOTE — Progress Notes (Signed)
Date last pap: 04/05/2022. Last Depo-Provera: 05/18/2022. Side Effects if any: None. Serum HCG indicated? N/A. Depo-Provera 150 mg IM given by: Rocco Serene, LPN. Site: Left Upper Outer Quadrant Next appointment due: 10/24/22-11/07/22.

## 2022-08-16 ENCOUNTER — Ambulatory Visit: Payer: Medicaid Other | Admitting: Nurse Practitioner

## 2022-08-16 ENCOUNTER — Encounter: Payer: Self-pay | Admitting: Nurse Practitioner

## 2022-08-16 VITALS — BP 112/70 | HR 96 | Temp 98.5°F | Resp 16 | Ht 65.0 in | Wt 162.6 lb

## 2022-08-16 DIAGNOSIS — J3081 Allergic rhinitis due to animal (cat) (dog) hair and dander: Secondary | ICD-10-CM | POA: Diagnosis not present

## 2022-08-16 DIAGNOSIS — J339 Nasal polyp, unspecified: Secondary | ICD-10-CM | POA: Diagnosis not present

## 2022-08-16 DIAGNOSIS — M6740 Ganglion, unspecified site: Secondary | ICD-10-CM

## 2022-08-16 NOTE — Assessment & Plan Note (Signed)
Start using Zyrtec and Pepcid.  Consider allergy shots.

## 2022-08-16 NOTE — Assessment & Plan Note (Signed)
Continue to use Flonase, will place referral to ENT.

## 2022-08-16 NOTE — Progress Notes (Signed)
BP 112/70   Pulse 96   Temp 98.5 F (36.9 C) (Oral)   Resp 16   Ht 5' 5"  (1.651 m)   Wt 162 lb 9.6 oz (73.8 kg)   LMP 07/25/2022   SpO2 98%   BMI 27.06 kg/m    Subjective:    Patient ID: Danielle Hammond, female    DOB: Jul 25, 2001, 21 y.o.   MRN: [id number]  HPI: Danielle Hammond is a 21 y.o. female  Chief Complaint  Patient presents with   Nose Problem    Possible polyps   Nasal polyps: She says she has had nasal polyps since she was a kid.  She says she was supposed to have them removed when she was younger.  But never got it done.  Patient has been using Flonase.  She would like a referral to ENT to get them removed.  Referral placed.  Ganglion cyst: Patient has a ganglion cyst on her right hand.  Discussed that it was benign.  Patient does not want to have it removed at this time.  Reassurance given.  Allergies to cats: Patient states that her significant other has a cat and she has been breaking out in hives occasionally.  Discussed taking Zyrtec and Pepcid.  Also discussed I could place a referral for allergy shots.  Patient is not interested at this time we will try p.o. medication first.  Relevant past medical, surgical, family and social history reviewed and updated as indicated. Interim medical history since our last visit reviewed. Allergies and medications reviewed and updated.  Review of Systems  Constitutional: Negative for fever or weight change.  Respiratory: Negative for cough and shortness of breath.   Cardiovascular: Negative for chest pain or palpitations.  Gastrointestinal: Negative for abdominal pain, no bowel changes.  Musculoskeletal: Negative for gait problem or joint swelling.  Skin: Negative for rash.  Neurological: Negative for dizziness or headache.  No other specific complaints in a complete review of systems (except as listed in HPI above).      Objective:    BP 112/70   Pulse 96   Temp 98.5 F (36.9 C) (Oral)   Resp 16   Ht 5' 5"   (1.651 m)   Wt 162 lb 9.6 oz (73.8 kg)   LMP 07/25/2022   SpO2 98%   BMI 27.06 kg/m   Wt Readings from Last 3 Encounters:  08/16/22 162 lb 9.6 oz (73.8 kg)  08/08/22 159 lb (72.1 kg)  06/16/22 155 lb 4.8 oz (70.4 kg)    Physical Exam  Constitutional: Patient appears well-developed and well-nourished.  No distress.  HEENT: head atraumatic, normocephalic, pupils equal and reactive to light,  neck supple, throat within normal limits, nasal polyp present bilateral Cardiovascular: Normal rate, regular rhythm and normal heart sounds.  No murmur heard. No BLE edema. Pulmonary/Chest: Effort normal and breath sounds normal. No respiratory distress. Abdominal: Soft.  There is no tenderness. Psychiatric: Patient has a normal mood and affect. behavior is normal. Judgment and thought content normal.  Results for orders placed or performed in visit on 06/16/22  CBC w/Diff/Platelet  Result Value Ref Range   WBC 7.7 3.8 - 10.8 Thousand/uL   RBC 4.71 3.80 - 5.10 Million/uL   Hemoglobin 13.4 11.7 - 15.5 g/dL   HCT 41.4 35.0 - 45.0 %   MCV 87.9 80.0 - 100.0 fL   MCH 28.5 27.0 - 33.0 pg   MCHC 32.4 32.0 - 36.0 g/dL   RDW 13.1 11.0 - 15.0 %  Platelets 351 140 - 400 Thousand/uL   MPV 12.5 7.5 - 12.5 fL   Neutro Abs 3,588 1,500 - 7,800 cells/uL   Lymphs Abs 3,157 850 - 3,900 cells/uL   Absolute Monocytes 562 200 - 950 cells/uL   Eosinophils Absolute 316 15 - 500 cells/uL   Basophils Absolute 77 0 - 200 cells/uL   Neutrophils Relative % 46.6 %   Total Lymphocyte 41.0 %   Monocytes Relative 7.3 %   Eosinophils Relative 4.1 %   Basophils Relative 1.0 %  Iron, TIBC and Ferritin Panel  Result Value Ref Range   Iron 101 40 - 190 mcg/dL   TIBC 368 250 - 450 mcg/dL (calc)   %SAT 27 16 - 45 % (calc)   Ferritin 14 (L) 16 - 154 ng/mL  COMPLETE METABOLIC PANEL WITH GFR  Result Value Ref Range   Glucose, Bld 89 65 - 99 mg/dL   BUN 15 7 - 25 mg/dL   Creat 0.93 0.50 - 0.96 mg/dL   eGFR 90 > OR = 60  mL/min/1.29m   BUN/Creatinine Ratio NOT APPLICABLE 6 - 22 (calc)   Sodium 141 135 - 146 mmol/L   Potassium 4.3 3.5 - 5.3 mmol/L   Chloride 105 98 - 110 mmol/L   CO2 25 20 - 32 mmol/L   Calcium 9.9 8.6 - 10.2 mg/dL   Total Protein 7.6 6.1 - 8.1 g/dL   Albumin 4.9 3.6 - 5.1 g/dL   Globulin 2.7 1.9 - 3.7 g/dL (calc)   AG Ratio 1.8 1.0 - 2.5 (calc)   Total Bilirubin 0.5 0.2 - 1.2 mg/dL   Alkaline phosphatase (APISO) 74 31 - 125 U/L   AST 18 10 - 30 U/L   ALT 16 6 - 29 U/L  TSH  Result Value Ref Range   TSH 2.51 mIU/L  FSH/LH  Result Value Ref Range   FSH 6.0 mIU/mL   LH 1.1 mIU/mL      Assessment & Plan:   Problem List Items Addressed This Visit       Respiratory   Nasal polyp - Primary    Continue to use Flonase, will place referral to ENT.      Relevant Orders   Ambulatory referral to ENT     Other   Ganglion cyst    Reassurance provided.      Cat allergies    Start using Zyrtec and Pepcid.  Consider allergy shots.        Follow up plan: Return if symptoms worsen or fail to improve.

## 2022-08-16 NOTE — Assessment & Plan Note (Signed)
Reassurance provided.

## 2022-10-06 ENCOUNTER — Other Ambulatory Visit: Payer: Self-pay | Admitting: Nurse Practitioner

## 2022-10-06 DIAGNOSIS — H539 Unspecified visual disturbance: Secondary | ICD-10-CM

## 2022-10-30 ENCOUNTER — Telehealth: Payer: Self-pay

## 2022-10-30 NOTE — Telephone Encounter (Signed)
Patient called triage line stating she has appointment here tomorrow for nurse to get Depo however, she no longer wants to take Depo. She wants to cancel that appointment and get an appointment with a provider.

## 2022-10-31 ENCOUNTER — Ambulatory Visit: Payer: Medicaid Other

## 2022-11-01 ENCOUNTER — Encounter: Payer: Self-pay | Admitting: Licensed Practical Nurse

## 2022-11-16 ENCOUNTER — Ambulatory Visit: Payer: Medicaid Other | Admitting: Licensed Practical Nurse

## 2022-11-16 VITALS — BP 131/97 | HR 103 | Ht 65.0 in | Wt 162.9 lb

## 2022-11-16 DIAGNOSIS — Z30011 Encounter for initial prescription of contraceptive pills: Secondary | ICD-10-CM

## 2022-11-16 DIAGNOSIS — N941 Unspecified dyspareunia: Secondary | ICD-10-CM

## 2022-11-16 DIAGNOSIS — R55 Syncope and collapse: Secondary | ICD-10-CM

## 2022-11-16 MED ORDER — NORETHINDRONE ACET-ETHINYL EST 1-20 MG-MCG PO TABS: 1.0000 | ORAL_TABLET | Freq: Every day | ORAL | 11 refills | Status: AC

## 2022-11-17 ENCOUNTER — Encounter: Payer: Self-pay | Admitting: Nurse Practitioner

## 2022-11-17 ENCOUNTER — Ambulatory Visit: Payer: Medicaid Other | Admitting: Nurse Practitioner

## 2022-11-17 VITALS — BP 142/96 | HR 104 | Temp 97.4°F | Resp 16 | Ht 65.0 in | Wt 162.6 lb

## 2022-11-17 DIAGNOSIS — N941 Unspecified dyspareunia: Secondary | ICD-10-CM | POA: Insufficient documentation

## 2022-11-17 DIAGNOSIS — R55 Syncope and collapse: Secondary | ICD-10-CM

## 2022-11-17 DIAGNOSIS — R030 Elevated blood-pressure reading, without diagnosis of hypertension: Secondary | ICD-10-CM

## 2022-11-17 DIAGNOSIS — R42 Dizziness and giddiness: Secondary | ICD-10-CM | POA: Diagnosis not present

## 2022-11-17 DIAGNOSIS — J4520 Mild intermittent asthma, uncomplicated: Secondary | ICD-10-CM | POA: Insufficient documentation

## 2022-11-17 DIAGNOSIS — R000 Tachycardia, unspecified: Secondary | ICD-10-CM | POA: Diagnosis not present

## 2022-11-17 MED ORDER — ALBUTEROL SULFATE HFA 108 (90 BASE) MCG/ACT IN AERS: 2.0000 | INHALATION_SPRAY | Freq: Four times a day (QID) | RESPIRATORY_TRACT | 5 refills | Status: DC | PRN

## 2022-11-17 MED ORDER — LABETALOL HCL 100 MG PO TABS: 100.0000 mg | ORAL_TABLET | Freq: Two times a day (BID) | ORAL | 0 refills | Status: DC

## 2022-11-17 NOTE — Progress Notes (Signed)
BP (!) 142/96   Pulse (!) 104   Temp (!) 97.4 F (36.3 C) (Oral)   Resp 16   Ht _0  (1.651 m)   Wt 162 lb 9.6 oz (73.8 kg)   SpO2 98%   Breastfeeding No   BMI 27.06 kg/m    Subjective:    Patient ID: Danielle Hammond, female    DOB: 2001/07/17, 21 y.o.   MRN: [id number]  HPI: Danielle Hammond is a 21 y.o. female  Chief Complaint  Patient presents with   Hypertension    Pt has not been diagnosed with High BP. Pt states she was seen at GYN and was advised to be checked for POTS   Dizziness    Pt has been feeling dizzy for months, has blacked out 5 times and felled   Elevated blood pressure reading: Patient had GYN appointment yesterday and her blood pressure was 131/97 and heart rate was 103. Patient reports she has been having headaches, dizziness and syncopal episodes.  Her blood pressure today is 142/96. Orthostatics performed and listed below.  Discussed treatment options and will trail labetalol.  Follow up in two weeks.   Tachycardia/dizziness/syncope: patient reports that she has had multiple syncopal episodes associated with standing up. She says this has been going on for months. She says she discussed with OBGYN and was told she might have POTS.  She says that her syncopal episode and says she was out for about three minutes. She says that she has been having palpitations.  Orthostatics performed in office. Will placed referral to cardiology.   Laying - 144/88 heart rate 99 Sitting - 138/84 heart rate 95 Standing- 140/96 heart rate 110 EKG showed: sinus tachycardia  Relevant past medical, surgical, family and social history reviewed and updated as indicated. Interim medical history since our last visit reviewed. Allergies and medications reviewed and updated.  Review of Systems  Constitutional: Negative for fever or weight change.  Respiratory: Negative for cough and shortness of breath.   Cardiovascular: Negative for chest pain, positive for  palpitations.   Gastrointestinal: Negative for abdominal pain, no bowel changes.  Musculoskeletal: Negative for gait problem or joint swelling.  Skin: Negative for rash.  Neurological: positive for dizziness for headache.  No other specific complaints in a complete review of systems (except as listed in HPI above).      Objective:    BP (!) 142/96   Pulse (!) 104   Temp (!) 97.4 F (36.3 C) (Oral)   Resp 16   Ht _1  (1.651 m)   Wt 162 lb 9.6 oz (73.8 kg)   SpO2 98%   Breastfeeding No   BMI 27.06 kg/m   Wt Readings from Last 3 Encounters:  11/17/22 162 lb 9.6 oz (73.8 kg)  11/16/22 162 lb 14.4 oz (73.9 kg)  08/16/22 162 lb 9.6 oz (73.8 kg)    Physical Exam  Constitutional: Patient appears well-developed and well-nourished.  No distress.  HEENT: head atraumatic, normocephalic, pupils equal and reactive to light, ears TMs clear, neck supple, throat within normal limits Cardiovascular: tachycardia rate, regular rhythm and normal heart sounds.  No murmur heard. No BLE edema. Pulmonary/Chest: Effort normal and breath sounds normal. No respiratory distress. Abdominal: Soft.  There is no tenderness. Psychiatric: Patient has a normal mood and affect. behavior is normal. Judgment and thought content normal.   Results for orders placed or performed in visit on 06/16/22  CBC w/Diff/Platelet  Result Value Ref Range   WBC 7.7  3.8 - 10.8 Thousand/uL   RBC 4.71 3.80 - 5.10 Million/uL   Hemoglobin 13.4 11.7 - 15.5 g/dL   HCT 41.4 35.0 - 45.0 %   MCV 87.9 80.0 - 100.0 fL   MCH 28.5 27.0 - 33.0 pg   MCHC 32.4 32.0 - 36.0 g/dL   RDW 13.1 11.0 - 15.0 %   Platelets 351 140 - 400 Thousand/uL   MPV 12.5 7.5 - 12.5 fL   Neutro Abs 3,588 1,500 - 7,800 cells/uL   Lymphs Abs 3,157 850 - 3,900 cells/uL   Absolute Monocytes 562 200 - 950 cells/uL   Eosinophils Absolute 316 15 - 500 cells/uL   Basophils Absolute 77 0 - 200 cells/uL   Neutrophils Relative % 46.6 %   Total Lymphocyte 41.0 %   Monocytes  Relative 7.3 %   Eosinophils Relative 4.1 %   Basophils Relative 1.0 %  Iron, TIBC and Ferritin Panel  Result Value Ref Range   Iron 101 40 - 190 mcg/dL   TIBC 368 250 - 450 mcg/dL (calc)   %SAT 27 16 - 45 % (calc)   Ferritin 14 (L) 16 - 154 ng/mL  COMPLETE METABOLIC PANEL WITH GFR  Result Value Ref Range   Glucose, Bld 89 65 - 99 mg/dL   BUN 15 7 - 25 mg/dL   Creat 0.93 0.50 - 0.96 mg/dL   eGFR 90 > OR = 60 mL/min/1.7m   BUN/Creatinine Ratio NOT APPLICABLE 6 - 22 (calc)   Sodium 141 135 - 146 mmol/L   Potassium 4.3 3.5 - 5.3 mmol/L   Chloride 105 98 - 110 mmol/L   CO2 25 20 - 32 mmol/L   Calcium 9.9 8.6 - 10.2 mg/dL   Total Protein 7.6 6.1 - 8.1 g/dL   Albumin 4.9 3.6 - 5.1 g/dL   Globulin 2.7 1.9 - 3.7 g/dL (calc)   AG Ratio 1.8 1.0 - 2.5 (calc)   Total Bilirubin 0.5 0.2 - 1.2 mg/dL   Alkaline phosphatase (APISO) 74 31 - 125 U/L   AST 18 10 - 30 U/L   ALT 16 6 - 29 U/L  TSH  Result Value Ref Range   TSH 2.51 mIU/L  FSH/LH  Result Value Ref Range   FSH 6.0 mIU/mL   LH 1.1 mIU/mL      Assessment & Plan:   Problem List Items Addressed This Visit       Respiratory   Mild intermittent asthma without complication    Patient reports her breathing has been good but needs a refill on her inhaler. Refill sent.       Relevant Medications   albuterol (VENTOLIN HFA) 108 (90 Base) MCG/ACT inhaler   Other Visit Diagnoses     Elevated blood pressure reading    -  Primary   start labatelol 100 mg bid, follow up in two weeks referral placed to cardiology   Relevant Medications   labetalol (NORMODYNE) 100 MG tablet   Other Relevant Orders   TSH   CBC with Differential/Platelet   COMPLETE METABOLIC PANEL WITH GFR   Ambulatory referral to Cardiology   Dizziness       start labatelol 100 mg bid, follow up in two weeks referral placed to cardiology   Relevant Orders   TSH   CBC with Differential/Platelet   COMPLETE METABOLIC PANEL WITH GFR   EKG 12-Lead    Ambulatory referral to Cardiology   Tachycardia       start labatelol 100 mg bid, follow  up in two weeks referral placed to cardiology   Relevant Orders   TSH   CBC with Differential/Platelet   COMPLETE METABOLIC PANEL WITH GFR   EKG 12-Lead   Ambulatory referral to Cardiology   Syncope, unspecified syncope type       start labatelol 100 mg bid, follow up in two weeks referral placed to cardiology   Relevant Orders   TSH   CBC with Differential/Platelet   COMPLETE METABOLIC PANEL WITH GFR   EKG 12-Lead   Ambulatory referral to Cardiology        Follow up plan: Return in about 2 weeks (around 12/01/2022) for follow up.

## 2022-11-17 NOTE — Assessment & Plan Note (Signed)
Patient reports her breathing has been good but needs a refill on her inhaler. Refill sent.

## 2022-11-17 NOTE — Progress Notes (Signed)
SUBJECTIVE: Here with partner and baby to discuss birth control options. Pt has been on Depo since she was discharged from the hospital after SVB on 02/19/2022. She states she has been bleeding since. The bleeding varies in amount, it can be light then later in the day it picks up. She desires a method of birth control that will not cause daily bleeding, she does desire cycles. She does not smoke. She is not breastfeeding.   Danielle Hammond reports that she has "passed Danielle Hammond few times in the months of October and November. Usually she is standing when this happens, she will fall to the floor-sometimes she is not able to move after the fall.  She does noticed  her heart rate increases and then she passes out. She has not seen anybody for this. She does eat and drink adequately.   Danielle Hammond reports pain with intercourse. The pain is after having sex. Every once in a while the pain is at the beginning or during sex, on occasion she has needed to stop sex. Pt desires pelvic exam.   Mood has "leveled out" doing well not on medication.   OBJECTIVE: BP (!) 131/97   Pulse (!) 103   Ht 5\' 5"  (1.651 m)   Wt 162 lb 14.4 oz (73.9 kg)   BMI 27.11 kg/m  GEN: Nad Abd: non tender no masses Bimanual exam: uterus non gravid, no masses, adnexa non tender no masses, no CMT. Tolerated exam well but some pain noted as this examiner's fingers entered the vagina.  Genitalia: WNL  SPEC exam: cervix without lesions, small amount of menses like discharge present   ASSESSMENT: Birth control counseling Fainting Pain with IC  PLAN: -Will stop Depo and start OCP's, discussed taking tablets TID x 1 week to stop the bleeding she is experiencing, pt prefers to start 1 tablet daily.  Script for Loestrin sent.  Reviewed BP is near the high side today, historically pt ahs had normal BP. Discussed concerns for high BP and OCP.  -encouraged pt to see PCP, the fainting could be related to POTS syndrome or another cardiac or neurological  concern  -Offered to see Pelvic PT, rec seeing seeing sex therapist, pt could try lubricants and increasing foreplay.  -RTC if pt has any concerns for her birth control or would like to discuss pain with IC further.    , CNM  Carie Caddy, [facility]  11/17/22  7:44 PM

## 2022-11-18 LAB — CBC WITH DIFFERENTIAL/PLATELET
Absolute Monocytes: 539 cells/uL (ref 200–950)
Basophils Absolute: 98 cells/uL (ref 0–200)
Basophils Relative: 1.4 %
Eosinophils Absolute: 399 cells/uL (ref 15–500)
Eosinophils Relative: 5.7 %
HCT: 39.2 % (ref 35.0–45.0)
Hemoglobin: 12.8 g/dL (ref 11.7–15.5)
Lymphs Abs: 2429 cells/uL (ref 850–3900)
MCH: 27.4 pg (ref 27.0–33.0)
MCHC: 32.7 g/dL (ref 32.0–36.0)
MCV: 83.8 fL (ref 80.0–100.0)
MPV: 12.1 fL (ref 7.5–12.5)
Monocytes Relative: 7.7 %
Neutro Abs: 3535 cells/uL (ref 1500–7800)
Neutrophils Relative %: 50.5 %
Platelets: 313 10*3/uL (ref 140–400)
RBC: 4.68 10*6/uL (ref 3.80–5.10)
RDW: 12.8 % (ref 11.0–15.0)
Total Lymphocyte: 34.7 %
WBC: 7 10*3/uL (ref 3.8–10.8)

## 2022-11-18 LAB — COMPLETE METABOLIC PANEL WITH GFR
AG Ratio: 1.8 (calc) (ref 1.0–2.5)
ALT: 10 U/L (ref 6–29)
AST: 12 U/L (ref 10–30)
Albumin: 4.6 g/dL (ref 3.6–5.1)
Alkaline phosphatase (APISO): 66 U/L (ref 31–125)
BUN: 19 mg/dL (ref 7–25)
CO2: 24 mmol/L (ref 20–32)
Calcium: 9.7 mg/dL (ref 8.6–10.2)
Chloride: 106 mmol/L (ref 98–110)
Creat: 0.73 mg/dL (ref 0.50–0.96)
Globulin: 2.5 g/dL (calc) (ref 1.9–3.7)
Glucose, Bld: 105 mg/dL — ABNORMAL HIGH (ref 65–99)
Potassium: 4.2 mmol/L (ref 3.5–5.3)
Sodium: 139 mmol/L (ref 135–146)
Total Bilirubin: 0.7 mg/dL (ref 0.2–1.2)
Total Protein: 7.1 g/dL (ref 6.1–8.1)
eGFR: 120 mL/min/{1.73_m2} (ref >=60)

## 2022-11-18 LAB — TSH: TSH: 3.32 mIU/L

## 2022-11-20 ENCOUNTER — Encounter: Payer: Self-pay | Admitting: Nurse Practitioner

## 2022-12-01 ENCOUNTER — Ambulatory Visit: Payer: Medicaid Other | Admitting: Nurse Practitioner

## 2022-12-01 ENCOUNTER — Encounter: Payer: Self-pay | Admitting: Nurse Practitioner

## 2022-12-01 ENCOUNTER — Other Ambulatory Visit: Payer: Self-pay

## 2022-12-01 VITALS — BP 110/72 | HR 91 | Temp 98.5°F | Resp 18 | Ht 65.0 in | Wt 161.7 lb

## 2022-12-01 DIAGNOSIS — I10 Essential (primary) hypertension: Secondary | ICD-10-CM | POA: Diagnosis not present

## 2022-12-01 NOTE — Progress Notes (Signed)
BP 110/72   Pulse 91   Temp 98.5 F (36.9 C) (Oral)   Resp 18   Ht _0  (1.651 m)   Wt 161 lb 11.2 oz (73.3 kg)   SpO2 97%   BMI 26.91 kg/m    Subjective:    Patient ID: Danielle Hammond, female    DOB: 2001/02/28, 21 y.o.   MRN: [id number]  HPI: Danielle Hammond is a 21 y.o. female  Chief Complaint  Patient presents with   Follow-up    2 week recheck   HTN: last appointment patient reported that she had an appointment with her GYN and head an elevated blood pressure 131/97 and heart rate was 103..  she had reported she had been having headaches, dizziness and syncopal episodes. Patient had said she had multiple syncopal episodes associated with standing up.  She says this had been going on for months. GYN suggested she had POTS.  she says that her syncopal episodes would last for abotu three minutes. She also reported that she had been having palpitations.  Orthostatics were performed at last office visit and are listed below. Her blood pressure at last visit was 142/96.  Started patient on labetalol.  Referral was placed to cardiology and patient has an appointment on 1/15 or 16. Patient blood pressure today is 110/72 with heart rate 91.  She reports that she is doing better. She has been taking the labetalol 100 mg two times a day.  She says that the palpitations have gotten better.  She has not had any syncopal episodes. She says she has had a couple dizzy spells but not as severe or frequent as before.  Recommend continuing with current treatment.  Patient reports she does not want to take the medication. She denies any side effects she just does not want to take medication. Patient reports she is going to try not taking it and see what her blood pressure does.    Orthostatics and EKG done on 11/17/2022  Laying - 144/88 heart rate 99 Sitting - 138/84 heart rate 95 Standing- 140/96 heart rate 110 EKG showed: sinus tachycardia  Relevant past medical, surgical, family and social  history reviewed and updated as indicated. Interim medical history since our last visit reviewed. Allergies and medications reviewed and updated.  Review of Systems  Constitutional: Negative for fever or weight change.  Respiratory: Negative for cough and shortness of breath.   Cardiovascular: Negative for chest pain, positive ( but improved ) for  palpitations.  Gastrointestinal: Negative for abdominal pain, no bowel changes.  Musculoskeletal: Negative for gait problem or joint swelling.  Skin: Negative for rash.  Neurological: positive ( but improved ) for dizziness, negative for headache.  No other specific complaints in a complete review of systems (except as listed in HPI above).      Objective:    BP 110/72   Pulse 91   Temp 98.5 F (36.9 C) (Oral)   Resp 18   Ht _1  (1.651 m)   Wt 161 lb 11.2 oz (73.3 kg)   SpO2 97%   BMI 26.91 kg/m   Wt Readings from Last 3 Encounters:  12/01/22 161 lb 11.2 oz (73.3 kg)  11/17/22 162 lb 9.6 oz (73.8 kg)  11/16/22 162 lb 14.4 oz (73.9 kg)    Physical Exam  Constitutional: Patient appears well-developed and well-nourished.  No distress.  HEENT: head atraumatic, normocephalic, pupils equal and reactive to light, ears TMs clear, neck supple, throat within normal limits Cardiovascular:normal  rate, regular rhythm and normal heart sounds.  No murmur heard. No BLE edema. Pulmonary/Chest: Effort normal and breath sounds normal. No respiratory distress. Abdominal: Soft.  There is no tenderness. Psychiatric: Patient has a normal mood and affect. behavior is normal. Judgment and thought content normal.   Results for orders placed or performed in visit on 11/17/22  TSH  Result Value Ref Range   TSH 3.32 mIU/L  CBC with Differential/Platelet  Result Value Ref Range   WBC 7.0 3.8 - 10.8 Thousand/uL   RBC 4.68 3.80 - 5.10 Million/uL   Hemoglobin 12.8 11.7 - 15.5 g/dL   HCT 39.2 35.0 - 45.0 %   MCV 83.8 80.0 - 100.0 fL   MCH 27.4 27.0 -  33.0 pg   MCHC 32.7 32.0 - 36.0 g/dL   RDW 12.8 11.0 - 15.0 %   Platelets 313 140 - 400 Thousand/uL   MPV 12.1 7.5 - 12.5 fL   Neutro Abs 3,535 1,500 - 7,800 cells/uL   Lymphs Abs 2,429 850 - 3,900 cells/uL   Absolute Monocytes 539 200 - 950 cells/uL   Eosinophils Absolute 399 15 - 500 cells/uL   Basophils Absolute 98 0 - 200 cells/uL   Neutrophils Relative % 50.5 %   Total Lymphocyte 34.7 %   Monocytes Relative 7.7 %   Eosinophils Relative 5.7 %   Basophils Relative 1.4 %  COMPLETE METABOLIC PANEL WITH GFR  Result Value Ref Range   Glucose, Bld 105 (H) 65 - 99 mg/dL   BUN 19 7 - 25 mg/dL   Creat 0.73 0.50 - 0.96 mg/dL   eGFR 120 > OR = 60 mL/min/1.35m   BUN/Creatinine Ratio SEE NOTE: 6 - 22 (calc)   Sodium 139 135 - 146 mmol/L   Potassium 4.2 3.5 - 5.3 mmol/L   Chloride 106 98 - 110 mmol/L   CO2 24 20 - 32 mmol/L   Calcium 9.7 8.6 - 10.2 mg/dL   Total Protein 7.1 6.1 - 8.1 g/dL   Albumin 4.6 3.6 - 5.1 g/dL   Globulin 2.5 1.9 - 3.7 g/dL (calc)   AG Ratio 1.8 1.0 - 2.5 (calc)   Total Bilirubin 0.7 0.2 - 1.2 mg/dL   Alkaline phosphatase (APISO) 66 31 - 125 U/L   AST 12 10 - 30 U/L   ALT 10 6 - 29 U/L      Assessment & Plan:   Problem List Items Addressed This Visit   None Visit Diagnoses     Essential hypertension    -  Primary   recommend continuing labetalol 100 mg BID.  patient reports she is going to stop taking the medication and see how her blood pressure does without it.        Follow up plan: Return in about 2 weeks (around 12/15/2022) for follow up.

## 2023-01-04 ENCOUNTER — Ambulatory Visit: Payer: Medicaid Other

## 2023-01-04 ENCOUNTER — Encounter: Payer: Self-pay | Admitting: Cardiology

## 2023-01-04 ENCOUNTER — Encounter: Payer: Self-pay | Admitting: Nurse Practitioner

## 2023-01-04 ENCOUNTER — Ambulatory Visit: Payer: Medicaid Other | Attending: Cardiology | Admitting: Cardiology

## 2023-01-04 ENCOUNTER — Other Ambulatory Visit: Payer: Self-pay | Admitting: Emergency Medicine

## 2023-01-04 VITALS — BP 129/86 | HR 78 | Ht 65.0 in | Wt 162.6 lb

## 2023-01-04 DIAGNOSIS — J45909 Unspecified asthma, uncomplicated: Secondary | ICD-10-CM

## 2023-01-04 DIAGNOSIS — R55 Syncope and collapse: Secondary | ICD-10-CM | POA: Diagnosis not present

## 2023-01-04 DIAGNOSIS — J4520 Mild intermittent asthma, uncomplicated: Secondary | ICD-10-CM

## 2023-01-04 DIAGNOSIS — I10 Essential (primary) hypertension: Secondary | ICD-10-CM

## 2023-01-04 MED ORDER — ALBUTEROL SULFATE HFA 108 (90 BASE) MCG/ACT IN AERS: 2.0000 | INHALATION_SPRAY | Freq: Four times a day (QID) | RESPIRATORY_TRACT | 5 refills | Status: DC | PRN

## 2023-01-04 NOTE — Progress Notes (Signed)
Cardiology Office Note:    Date:  01/04/2023   ID:  Danielle Hammond, DOB [birth year], MRN [id number]  PCP:  Bo Merino, [facility] Cardiologist:  Kate Sable, MD     Referring MD: Bo Merino, FNP   Chief Complaint  Patient presents with   New Patient (Initial Visit)    HTN, Dizziness, Tachycardia, Syncope w/collapse, SOB and chest pain, Family Hx    History of Present Illness:    Danielle Hammond is a 22 y.o. female with a hx of hypertension, asthma who presents due to hypertension and syncope.  Patient had a baby 9 months ago.  4 months after delivery, with baby in her sink, patient states feeling warm/flushed and passed out for about 3 minutes or so.  She woke up from the floor, was very worried.  States having 2 other episodes of passing out over the next 5 months, last episode was 2 weeks ago while patient was standing.  Does not remember any dizziness or flutter feeling this time, heart rate was elevated.  Diagnosed with elevated blood pressure, started on labetalol about 4 months ago, patient does not like to take BP meds, as such stopped taking labetalol.  Blood pressures have been in the 130s.  Has a history of asthma.  Denies any history of heart disease.  States having episodes of rapid heart rates seem to occur with passing out.  Past Medical History:  Diagnosis Date   Acne    Allergic rhinitis    Anxiety    Asthma    Depression    Dysmenorrhea in adolescent    Epistaxis    Preterm labor     Past Surgical History:  Procedure Laterality Date   NO PAST SURGERIES      Current Medications: Current Meds  Medication Sig   albuterol (VENTOLIN HFA) 108 (90 Base) MCG/ACT inhaler Inhale 2 puffs into the lungs every 6 (six) hours as needed. For wheezing   fluticasone (FLONASE) 50 MCG/ACT nasal spray Place 2 sprays into both nostrils daily.   norethindrone-ethinyl estradiol (LOESTRIN 1/20, 21,) 1-20 MG-MCG tablet Take 1 tablet by  mouth daily.     Allergies:   Procaine and Procaine hcl   Social History   Socioeconomic History   Marital status: Single    Spouse name: Not on file   Number of children: 1   Years of education: Not on file   Highest education level: Not on file  Occupational History   Not on file  Tobacco Use   Smoking status: Never    Passive exposure: Past   Smokeless tobacco: Never  Vaping Use   Vaping Use: Never used  Substance and Sexual Activity   Alcohol use: Never   Drug use: Never   Sexual activity: Yes    Birth control/protection: None, Injection    Comment: depo  Other Topics Concern   Not on file  Social History Narrative   Not on file   Social Determinants of Health   Financial Resource Strain: Not on file  Food Insecurity: Not on file  Transportation Needs: Not on file  Physical Activity: Not on file  Stress: Not on file  Social Connections: Not on file     Family History: The patient's family history includes Heart Problems in her maternal grandmother. There is no history of Breast cancer or Ovarian cancer.  ROS:   Please see the history of present illness.     All other systems  reviewed and are negative.  EKGs/Labs/Other Studies Reviewed:    The following studies were reviewed today:   EKG:  EKG is  ordered today.  The ekg ordered today demonstrates normal sinus rhythm, normal ECG  Recent Labs: 11/17/2022: ALT 10; BUN 19; Creat 0.73; Hemoglobin 12.8; Platelets 313; Potassium 4.2; Sodium 139; TSH 3.32  Recent Lipid Panel No results found for: "CHOL", "TRIG", "HDL", "CHOLHDL", "VLDL", "LDLCALC", "LDLDIRECT"   Risk Assessment/Calculations:             Physical Exam:    VS:  BP 129/86 (BP Location: Right Arm, Patient Position: Sitting, Cuff Size: Normal)   Pulse 78   Ht 5\' 5"  (1.651 m)   Wt 162 lb 9.6 oz (73.8 kg)   SpO2 99%   BMI 27.06 kg/m     Wt Readings from Last 3 Encounters:  01/04/23 162 lb 9.6 oz (73.8 kg)  12/01/22 161 lb 11.2 oz  (73.3 kg)  11/17/22 162 lb 9.6 oz (73.8 kg)     GEN:  Well nourished, well developed in no acute distress HEENT: Normal NECK: No JVD; No carotid bruits CARDIAC: RRR, no murmurs, rubs, gallops RESPIRATORY:  Clear to auscultation without rales, wheezing or rhonchi  ABDOMEN: Soft, non-tender, non-distended MUSCULOSKELETAL:  No edema; No deformity  SKIN: Warm and dry NEUROLOGIC:  Alert and oriented x 3 PSYCHIATRIC:  Normal affect   ASSESSMENT:    1. Syncope and collapse   2. Hypertension, unspecified type   3. Asthma, unspecified asthma severity, unspecified whether complicated, unspecified whether persistent    PLAN:    In order of problems listed above:  Syncope, etiology unclear.  Components of vasovagal with prodromal flushing noted during initial episode.  Place cardiac monitor to evaluate any significant arrhythmias due to symptoms of palpitations.  Get echocardiogram.  Orthostatic vitals with no evidence for orthostasis.  If she has further symptoms, may consider an ILR.  Although overall I believe vasovagal etiology seems more likely. Hypertension, BP controlled off labetalol.  Low-salt diet advised. History of asthma, follow-up with PCP regarding rescue inhalers, management  Follow-up after echo and cardiac monitor.      Medication Adjustments/Labs and Tests Ordered: Current medicines are reviewed at length with the patient today.  Concerns regarding medicines are outlined above.  Orders Placed This Encounter  Procedures   LONG TERM MONITOR (3-14 DAYS)   EKG 12-Lead   ECHOCARDIOGRAM COMPLETE   No orders of the defined types were placed in this encounter.   Patient Instructions  Medication Instructions:   Your physician recommends that you continue on your current medications as directed. Please refer to the Current Medication list given to you today.  *If you need a refill on your cardiac medications before your next appointment, please call your  pharmacy*   Lab Work:  None Ordered  If you have labs (blood work) drawn today and your tests are completely normal, you will receive your results only by: [place] (if you have MyChart) OR A paper copy in the mail If you have any lab test that is abnormal or we need to change your treatment, we will call you to review the results.   Testing/Procedures:  Echocardiogram   Please return to [facility] on ______________ at _______________ AM/PM for an [place] has requested that you have an echocardiogram. Echocardiography is a painless test that uses sound waves to create images of your heart. It provides your doctor with information about the size and shape of your  heart and how well your heart's chambers and valves are working. This procedure takes approximately one hour. There are no restrictions for this procedure. Please note; depending on visual quality an IV may need to be placed.   2. Your physician has recommended that you wear a Zio monitor.   This monitor is a medical device that records the heart's electrical activity. Doctors most often use these monitors to diagnose arrhythmias. Arrhythmias are problems with the speed or rhythm of the heartbeat. The monitor is a small device applied to your chest. You can wear one while you do your normal daily activities. While wearing this monitor if you have any symptoms to push the button and record what you felt. Once you have worn this monitor for the period of time provider prescribed (Usually 14 days), you will return the monitor device in the postage paid box. Once it is returned they will download the data collected and provide [place] with a report which the provider will then review and we will call you with those results. Important tips:  Avoid showering during the first 24 hours of wearing the monitor. Avoid excessive sweating to help maximize wear time. Do not submerge the device, no hot tubs,  and no swimming pools. Keep any lotions or oils away from the patch. After 24 hours you may shower with the patch on. Take brief showers with your back facing the shower head.  Do not remove patch once it has been placed because that will interrupt data and decrease adhesive wear time. Push the button when you have any symptoms and write down what you were feeling. Once you have completed wearing your monitor, remove and place into box which has postage paid and place in your outgoing mailbox.  If for some reason you have misplaced your box then call our office and we can provide another box and/or mail it off for you.     Follow-Up: At [facility], you and your health needs are our priority.  As part of our continuing mission to provide you with exceptional heart care, we have created designated Provider Care Teams.  These Care Teams include your primary Cardiologist (physician) and Advanced Practice Providers (APPs -  Physician Assistants and Nurse Practitioners) who all work together to provide you with the care you need, when you need it.  We recommend signing up for the patient portal called "MyChart".  Sign up information is provided on this After Visit Summary.  MyChart is used to connect with patients for Virtual Visits (Telemedicine).  Patients are able to view lab/test results, encounter notes, upcoming appointments, etc.  Non-urgent messages can be sent to your provider as well.   To learn more about what you can do with MyChart, go to [email or web address].    Your next appointment:    After testing   Provider:   You may see Debbe Odea, MD or one of the following Advanced Practice Providers on your designated Care Team:   Nicolasa Ducking, NP Eula Listen, PA-C Cadence Fransico Michael, PA-C Charlsie Quest, NP   Signed, Debbe Odea, MD  01/04/2023 12:10 PM    [facility]

## 2023-01-04 NOTE — Patient Instructions (Signed)
Medication Instructions:   Your physician recommends that you continue on your current medications as directed. Please refer to the Current Medication list given to you today.  *If you need a refill on your cardiac medications before your next appointment, please call your pharmacy*   Lab Work:  None Ordered  If you have labs (blood work) drawn today and your tests are completely normal, you will receive your results only by: [place] (if you have MyChart) OR A paper copy in the mail If you have any lab test that is abnormal or we need to change your treatment, we will call you to review the results.   Testing/Procedures:  Echocardiogram   Please return to [facility] on ______________ at _______________ AM/PM for an [place] has requested that you have an echocardiogram. Echocardiography is a painless test that uses sound waves to create images of your heart. It provides your doctor with information about the size and shape of your heart and how well your heart's chambers and valves are working. This procedure takes approximately one hour. There are no restrictions for this procedure. Please note; depending on visual quality an IV may need to be placed.   2. Your physician has recommended that you wear a Zio monitor.   This monitor is a medical device that records the heart's electrical activity. Doctors most often use these monitors to diagnose arrhythmias. Arrhythmias are problems with the speed or rhythm of the heartbeat. The monitor is a small device applied to your chest. You can wear one while you do your normal daily activities. While wearing this monitor if you have any symptoms to push the button and record what you felt. Once you have worn this monitor for the period of time provider prescribed (Usually 14 days), you will return the monitor device in the postage paid box. Once it is returned they will download the data collected and provide  [place] with a report which the provider will then review and we will call you with those results. Important tips:  Avoid showering during the first 24 hours of wearing the monitor. Avoid excessive sweating to help maximize wear time. Do not submerge the device, no hot tubs, and no swimming pools. Keep any lotions or oils away from the patch. After 24 hours you may shower with the patch on. Take brief showers with your back facing the shower head.  Do not remove patch once it has been placed because that will interrupt data and decrease adhesive wear time. Push the button when you have any symptoms and write down what you were feeling. Once you have completed wearing your monitor, remove and place into box which has postage paid and place in your outgoing mailbox.  If for some reason you have misplaced your box then call our office and we can provide another box and/or mail it off for you.     Follow-Up: At [facility], you and your health needs are our priority.  As part of our continuing mission to provide you with exceptional heart care, we have created designated Provider Care Teams.  These Care Teams include your primary Cardiologist (physician) and Advanced Practice Providers (APPs -  Physician Assistants and Nurse Practitioners) who all work together to provide you with the care you need, when you need it.  We recommend signing up for the patient portal called "MyChart".  Sign up information is provided on this After Visit Summary.  MyChart is used to connect with patients  for Virtual Visits (Telemedicine).  Patients are able to view lab/test results, encounter notes, upcoming appointments, etc.  Non-urgent messages can be sent to your provider as well.   To learn more about what you can do with MyChart, go to [email or web address].    Your next appointment:    After testing   Provider:   You may see Kate Sable, MD or one of the following Advanced Practice Providers on  your designated Care Team:   Murray Hodgkins, NP Christell Faith, PA-C Cadence Kathlen Mody, PA-C Gerrie Nordmann, NP

## 2023-01-09 ENCOUNTER — Telehealth: Payer: Self-pay

## 2023-01-09 NOTE — Telephone Encounter (Signed)
Called patient to verify address for ZIO.   Patient reports that her address is:  [address] [place] [address]   Called ZIO - Spoke with Vickie  She updated the address in zio suite.

## 2023-01-11 ENCOUNTER — Telehealth: Payer: Medicaid Other | Admitting: Physician Assistant

## 2023-01-11 DIAGNOSIS — J019 Acute sinusitis, unspecified: Secondary | ICD-10-CM | POA: Diagnosis not present

## 2023-01-11 DIAGNOSIS — J4520 Mild intermittent asthma, uncomplicated: Secondary | ICD-10-CM

## 2023-01-11 DIAGNOSIS — B9689 Other specified bacterial agents as the cause of diseases classified elsewhere: Secondary | ICD-10-CM

## 2023-01-11 MED ORDER — ALBUTEROL SULFATE HFA 108 (90 BASE) MCG/ACT IN AERS: 2.0000 | INHALATION_SPRAY | Freq: Four times a day (QID) | RESPIRATORY_TRACT | 5 refills | Status: AC | PRN

## 2023-01-11 MED ORDER — AMOXICILLIN-POT CLAVULANATE 875-125 MG PO TABS: 1.0000 | ORAL_TABLET | Freq: Two times a day (BID) | ORAL | 0 refills | Status: DC

## 2023-01-11 NOTE — Patient Instructions (Signed)
Elray Mcgregor, thank you for joining [name], PA-C for today's virtual visit.  While this provider is not your primary care provider (PCP), if your PCP is located in our provider database this encounter information will be shared with them immediately following your visit.   [place] account gives you access to today's visit and all your visits, tests, and labs performed at [facility] " click here if you don't have a [facility] account or go to [email or web address]  Consent: (Patient) Zayli Villafuerte provided verbal consent for this virtual visit at the beginning of the encounter.  Current Medications:  Current Outpatient Medications:    albuterol (VENTOLIN HFA) 108 (90 Base) MCG/ACT inhaler, Inhale 2 puffs into the lungs every 6 (six) hours as needed. For wheezing, Disp: 1 each, Rfl: 5   fluticasone (FLONASE) 50 MCG/ACT nasal spray, Place 2 sprays into both nostrils daily., Disp: 16 g, Rfl: 3   norethindrone-ethinyl estradiol (LOESTRIN 1/20, 21,) 1-20 MG-MCG tablet, Take 1 tablet by mouth daily., Disp: 28 tablet, Rfl: 11   Medications ordered in this encounter:  No orders of the defined types were placed in this encounter.    *If you need refills on other medications prior to your next appointment, please contact your pharmacy*  Follow-Up: Call back or seek an in-person evaluation if the symptoms worsen or if the condition fails to improve as anticipated.  [place] [phone]  Other Instructions Please take antibiotic as directed.  Increase fluid intake.  Use Saline nasal spray.  Take a daily multivitamin. Albuterol has been refilled.  Place a humidifier in the bedroom.  Please call or return clinic if symptoms are not improving.  Sinusitis Sinusitis is redness, soreness, and swelling (inflammation) of the paranasal sinuses. Paranasal sinuses are air pockets within the bones of your face (beneath the eyes, the  middle of the forehead, or above the eyes). In healthy paranasal sinuses, mucus is able to drain out, and air is able to circulate through them by way of your nose. However, when your paranasal sinuses are inflamed, mucus and air can become trapped. This can allow bacteria and other germs to grow and cause infection. Sinusitis can develop quickly and last only a short time (acute) or continue over a long period (chronic). Sinusitis that lasts for more than 12 weeks is considered chronic.  CAUSES  Causes of sinusitis include: Allergies. Structural abnormalities, such as displacement of the cartilage that separates your nostrils (deviated septum), which can decrease the air flow through your nose and sinuses and affect sinus drainage. Functional abnormalities, such as when the small hairs (cilia) that line your sinuses and help remove mucus do not work properly or are not present. SYMPTOMS  Symptoms of acute and chronic sinusitis are the same. The primary symptoms are pain and pressure around the affected sinuses. Other symptoms include: Upper toothache. Earache. Headache. Bad breath. Decreased sense of smell and taste. A cough, which worsens when you are lying flat. Fatigue. Fever. Thick drainage from your nose, which often is green and may contain pus (purulent). Swelling and warmth over the affected sinuses. DIAGNOSIS  Your caregiver will perform a physical exam. During the exam, your caregiver may: Look in your nose for signs of abnormal growths in your nostrils (nasal polyps). Tap over the affected sinus to check for signs of infection. View the inside of your sinuses (endoscopy) with a special imaging device with a light attached (endoscope), which is inserted into your  sinuses. If your caregiver suspects that you have chronic sinusitis, one or more of the following tests may be recommended: Allergy tests. Nasal culture A sample of mucus is taken from your nose and sent to a lab and  screened for bacteria. Nasal cytology A sample of mucus is taken from your nose and examined by your caregiver to determine if your sinusitis is related to an allergy. TREATMENT  Most cases of acute sinusitis are related to a viral infection and will resolve on their own within 10 days. Sometimes medicines are prescribed to help relieve symptoms (pain medicine, decongestants, nasal steroid sprays, or saline sprays).  However, for sinusitis related to a bacterial infection, your caregiver will prescribe antibiotic medicines. These are medicines that will help kill the bacteria causing the infection.  Rarely, sinusitis is caused by a fungal infection. In theses cases, your caregiver will prescribe antifungal medicine. For some cases of chronic sinusitis, surgery is needed. Generally, these are cases in which sinusitis recurs more than 3 times per year, despite other treatments. HOME CARE INSTRUCTIONS  Drink plenty of water. Water helps thin the mucus so your sinuses can drain more easily. Use a humidifier. Inhale steam 3 to 4 times a day (for example, sit in the bathroom with the shower running). Apply a warm, moist washcloth to your face 3 to 4 times a day, or as directed by your caregiver. Use saline nasal sprays to help moisten and clean your sinuses. Take over-the-counter or prescription medicines for pain, discomfort, or fever only as directed by your caregiver. SEEK IMMEDIATE MEDICAL CARE IF: You have increasing pain or severe headaches. You have nausea, vomiting, or drowsiness. You have swelling around your face. You have vision problems. You have a stiff neck. You have difficulty breathing. MAKE SURE YOU:  Understand these instructions. Will watch your condition. Will get help right away if you are not doing well or get worse. Document Released: 12/04/2005 Document Revised: 02/26/2012 Document Reviewed: 12/19/2011 [facility] Patient Information 2014 [place], [place].    If you have  been instructed to have an in-person evaluation today at a local Urgent Care facility, please use the link below. It will take you to a list of all of our available [facility], including address, phone number and hours of operation. Please do not delay care.  [facility]  If you or a family member do not have a primary care provider, use the link below to schedule a visit and establish care. When you choose a [facility] primary care physician or advanced practice provider, you gain a long-term partner in health. Find a Primary Care Provider  Learn more about [place]'s in-office and virtual care options: [facility]

## 2023-01-11 NOTE — Progress Notes (Signed)
Virtual Visit Consent   Danielle Hammond, you are scheduled for a virtual visit with a [facility] provider today. Just as with appointments in the office, your consent must be obtained to participate. Your consent will be active for this visit and any virtual visit you may have with one of our providers in the next 365 days. If you have a MyChart account, a copy of this consent can be sent to you electronically.  As this is a virtual visit, video technology does not allow for your provider to perform a traditional examination. This may limit your provider's ability to fully assess your condition. If your provider identifies any concerns that need to be evaluated in person or the need to arrange testing (such as labs, EKG, etc.), we will make arrangements to do so. Although advances in technology are sophisticated, we cannot ensure that it will always work on either your end or our end. If the connection with a video visit is poor, the visit may have to be switched to a telephone visit. With either a video or telephone visit, we are not always able to ensure that we have a secure connection.  By engaging in this virtual visit, you consent to the provision of healthcare and authorize for your insurance to be billed (if applicable) for the services provided during this visit. Depending on your insurance coverage, you may receive a charge related to this service.  I need to obtain your verbal consent now. Are you willing to proceed with your visit today? Bronwyn Belasco has provided verbal consent on 01/11/2023 for a virtual visit (video or telephone). Danielle Hammond, [place]  Date: 01/11/2023 11:30 AM  Virtual Visit via Video Note   I, Danielle Hammond, connected with  Kloee Ballew  ([id number], 2001/11/15) on 01/11/23 at 11:15 AM EST by a video-enabled telemedicine application and verified that I am speaking with the correct person using two identifiers.  Location: Patient: Virtual Visit  Location Patient: Home Provider: Virtual Visit Location Provider: Home Office   I discussed the limitations of evaluation and management by telemedicine and the availability of in person appointments. The patient expressed understanding and agreed to proceed.    History of Present Illness: Danielle Hammond is a 22 y.o. who identifies as a female who was assigned female at birth, and is being seen today for sinus symptoms starting over weekend with sore throat, congestion and headache. This has continued to progress now with thicker discharge, sinus pain, facial pain and pain of upper teeth. Has asthma and did have to use albuterol yesterday. Is out of this and needs refill. Denies chest pain or overt SOB, just some tightness. Has taken OTC Ibuprofen.  HPI: HPI  Problems:  Patient Active Problem List   Diagnosis Date Noted   Mild intermittent asthma without complication 69/67/8938   Pain in female genitalia on intercourse 11/17/2022   Nasal polyp 08/16/2022   Ganglion cyst 08/16/2022   Cat allergies 08/16/2022   Other specified anemias 06/16/2022   Vaginal bleeding problems 06/16/2022   Anxiety and depression 08/31/2021    Allergies:  Allergies  Allergen Reactions   Procaine Swelling    Facial swelling   Procaine Hcl Swelling    Facial swelling   Medications:  Current Outpatient Medications:    amoxicillin-clavulanate (AUGMENTIN) 875-125 MG tablet, Take 1 tablet by mouth 2 (two) times daily., Disp: 14 tablet, Rfl: 0   albuterol (VENTOLIN HFA) 108 (90 Base) MCG/ACT inhaler, Inhale 2 puffs into the lungs every  6 (six) hours as needed. For wheezing, Disp: 1 each, Rfl: 5   fluticasone (FLONASE) 50 MCG/ACT nasal spray, Place 2 sprays into both nostrils daily., Disp: 16 g, Rfl: 3   norethindrone-ethinyl estradiol (LOESTRIN 1/20, 21,) 1-20 MG-MCG tablet, Take 1 tablet by mouth daily., Disp: 28 tablet, Rfl: 11  Observations/Objective: Patient is well-developed, well-nourished in no acute  distress.  Resting comfortably at home.  Head is normocephalic, atraumatic.  No labored breathing. Speech is clear and coherent with logical content.  Patient is alert and oriented at baseline.   Assessment and Plan: 1. Acute bacterial sinusitis - amoxicillin-clavulanate (AUGMENTIN) 875-125 MG tablet; Take 1 tablet by mouth 2 (two) times daily.  Dispense: 14 tablet; Refill: 0  Rx Augmentin.  Increase fluids.  Rest.  Saline nasal spray.  Probiotic.  Mucinex as directed.  Humidifier in bedroom. Albuterol refilled.  Call or return to clinic if symptoms are not improving.   Follow Up Instructions: I discussed the assessment and treatment plan with the patient. The patient was provided an opportunity to ask questions and all were answered. The patient agreed with the plan and demonstrated an understanding of the instructions.  A copy of instructions were sent to the patient via MyChart unless otherwise noted below.   The patient was advised to call back or seek an in-person evaluation if the symptoms worsen or if the condition fails to improve as anticipated.  Time:  I spent 10 minutes with the patient via telehealth technology discussing the above problems/concerns.    Danielle Rio, PA-C

## 2023-01-24 ENCOUNTER — Ambulatory Visit (INDEPENDENT_AMBULATORY_CARE_PROVIDER_SITE_OTHER): Payer: Medicaid Other

## 2023-01-24 ENCOUNTER — Other Ambulatory Visit (HOSPITAL_COMMUNITY)
Admission: RE | Admit: 2023-01-24 | Discharge: 2023-01-24 | Disposition: A | Discharge status: Home / Self Care | Payer: Medicaid Other | Source: Ambulatory Visit | Attending: Licensed Practical Nurse | Admitting: Licensed Practical Nurse

## 2023-01-24 ENCOUNTER — Ambulatory Visit: Payer: Self-pay | Admitting: *Deleted

## 2023-01-24 VITALS — BP 129/80 | HR 97 | Ht 65.0 in | Wt 161.0 lb

## 2023-01-24 DIAGNOSIS — N898 Other specified noninflammatory disorders of vagina: Secondary | ICD-10-CM | POA: Diagnosis present

## 2023-01-24 NOTE — Progress Notes (Signed)
    NURSE VISIT NOTE  Subjective:    Patient ID: Danielle Hammond, female    DOB: October 05, 2001, 22 y.o.   MRN: [id number]  HPI  Patient is a 22 y.o. G13P0101 female who presents for vaginal itching and irritation for the past week.  Denies significant pelvic pain or fever.   Objective:    BP 129/80   Pulse 97   Ht 5\' 5"  (1.651 m)   Wt 161 lb (73 kg)   Breastfeeding No   BMI 26.79 kg/m     Assessment:   1. Vaginal irritation   2. Vaginal itching      Plan:   Yeast, BV, Trich, GC and chlamydia DNA probe sent to lab. Treatment: Patient will wait for results to be properly treated. ROV prn if symptoms persist or worsen.   Drenda Freeze, CMA

## 2023-01-24 NOTE — Telephone Encounter (Signed)
Chief Complaint: Asthma Symptoms: Mother calling, pt present. Mother states "When she gets like this she makes this weird noise, not really wheezing." States off and on for 1 week "When she makes this noise it is hard on her heart." Calling for albuterol inhaler, pt is out as she has been using frequently. Mother states had OV 2 weeks ago, asking for nebulizer treatments  for home. Offered and advised appt, declines, pt states she is working.  Frequency: 1 week Pertinent Negatives: Patient denies  Disposition: [] ED /[] Urgent Care (no appt availability in office) / [] Appointment(In office/virtual)/ []  [facility]/ [] Home Care/ [] Refused Recommended Disposition /[] [facility]/ [x]  Follow-up with PCP Additional Notes: Please see encounter and advise. Mother and pt evasive historians, mother states "I know what I went through with her as a child, the nebs helps." Assured NT would route to practice for PCPs review. Advised ED/UC for worsening symptoms. Reason for Disposition  Triage nurse thinks child with acute asthma attack needs an exam  Answer Assessment - Initial Assessment Questions 1. SEVERITY: "How bad is this attack? Describe your child's breathing. What does it sound like?" * MILD: no SOB at rest, mild SOB with walking, speaks normally in sentences, can lay down flat,  no retractions, wheezes only heard by stethoscope (GREEN Zone: PEFR 80-100%)  * MODERATE: SOB at rest, speaks in phrases, prefers to sit (can't lay down flat), mild retractions, audible wheezing (YELLOW Zone: PEFR 50-80%) * SEVERE: severe SOB at rest, speaks in single words (struggling to breathe), severe retractions, usually loud wheezing or sometimes minimal wheezing because of decreased air movement (RED Zone: PEFR < 50%)  * MODERATE and SEVERE asthma attacks also interfere with normal activities and sleep (Reason: too hypoxic to sleep). SEVERE hypoxia can also cause confusion or altered mental status.       Varies 2. PEAK EXPIRATORY FLOW RATE (PEFR): "Do you use a peak flow meter?" If so, ask: "What's the current peak flow? What's your child's normal peak flow?" (AGE 84 years or older).      3. ONSET: "When did this asthma attack start?"      1 week ago 4. TRIGGER: "What do you think triggered this attack?" (e.g. URI, exposure to pollen or other allergen, tobacco smoke)      Unsure 5. INHALED RESCUE MEDS (inhaler or nebs): "What is your child's asthma rescue medicine?" Note: The neb or inhaler rescue treatments listed in the triage questions refers to quick-relief SINGLE medicines such as albuterol, xopenex or salbutamol ([place]). CAUTION:  If patient is using a COMBINATION medicine (Symbicort, Dulera, Advair, Breo, AirDuo Respiclick) as a rescue medicine consult PCP for dosing more frequent than every 4 hours.       6. INHALED STEROID: "Does your child also take an inhaled steroid (e.g., Pulmicort, Flovent, Qvar, etc )?" Controller or maintenance asthma medicines refer to anti-inflammatory medicines such as inhaled steroids or oral singulair. They are not helpful at reversing acute asthma attacks. However, controller medicines should be continued during the attack.      7. INHALED TREATMENTS GIVEN: "What treatments have you given so far?" and "How often?" If using an inhaler, ask, "How many puffs?" Recommended SINGLE medicine rescue Inhaler Dosage: Routine treatments are 2 puffs every 4 hours as needed.  Rescue treatments are 4 puffs. NOTE: A routine "treatment" is defined as 1 neb treatment OR 2 or more puffs of SINGLE medicine rescue inhaler.  Back-to-back treatments are considered 2 or more SINGLE medicine  treatments within a 2 hour period. CAUTION:  If patient is using a COMBINATION medicine (Symbicort, Dulera, Advair, Breo, AirDuo Respiclick) as a rescue medicine consult PCP for dosing more frequent than every 4 hours.       8. INHALER: "How long have you had this inhaler?" "Could it be empty?"        9. SPACER: "Do you have a spacer?" If yes, "Are you using it?"       Note to Triager - Respiratory Distress: Always rule out respiratory distress (also known as working hard to breathe or shortness of breath). Listen for grunting, stridor, wheezing, tachypnea in these calls. How to assess: Listen to the child's breathing early in your assessment. Reason: What you hear is often more valid than the caller's answers to your triage questions.  Protocols used: Asthma-P-AH

## 2023-01-25 ENCOUNTER — Encounter: Payer: Self-pay | Admitting: Licensed Practical Nurse

## 2023-01-25 LAB — CERVICOVAGINAL ANCILLARY ONLY
Bacterial Vaginitis (gardnerella): NEGATIVE
Candida Glabrata: NEGATIVE
Candida Vaginitis: POSITIVE — AB
Chlamydia: NEGATIVE
Comment: NEGATIVE
Comment: NEGATIVE
Comment: NEGATIVE
Comment: NEGATIVE
Comment: NEGATIVE
Comment: NORMAL
Neisseria Gonorrhea: NEGATIVE
Trichomonas: NEGATIVE

## 2023-01-25 NOTE — Progress Notes (Deleted)
   There were no vitals taken for this visit.   Subjective:    Patient ID: Danielle Hammond, female    DOB: [birth year], 22 y.o.   MRN: [id number]  HPI: Danielle Hammond is a 22 y.o. female  No chief complaint on file.   Relevant past medical, surgical, family and social history reviewed and updated as indicated. Interim medical history since our last visit reviewed. Allergies and medications reviewed and updated.  Review of Systems  Constitutional: Negative for fever or weight change.  Respiratory: Negative for cough and shortness of breath.   Cardiovascular: Negative for chest pain or palpitations.  Gastrointestinal: Negative for abdominal pain, no bowel changes.  Musculoskeletal: Negative for gait problem or joint swelling.  Skin: Negative for rash.  Neurological: Negative for dizziness or headache.  No other specific complaints in a complete review of systems (except as listed in HPI above).      Objective:    There were no vitals taken for this visit.  Wt Readings from Last 3 Encounters:  01/24/23 161 lb (73 kg)  01/04/23 162 lb 9.6 oz (73.8 kg)  12/01/22 161 lb 11.2 oz (73.3 kg)    Physical Exam  Constitutional: Patient appears well-developed and well-nourished. Obese *** No distress.  HEENT: head atraumatic, normocephalic, pupils equal and reactive to light, ears ***, neck supple, throat within normal limits Cardiovascular: Normal rate, regular rhythm and normal heart sounds.  No murmur heard. No BLE edema. Pulmonary/Chest: Effort normal and breath sounds normal. No respiratory distress. Abdominal: Soft.  There is no tenderness. Psychiatric: Patient has a normal mood and affect. behavior is normal. Judgment and thought content normal.     Assessment & Plan:   Problem List Items Addressed This Visit   None    Follow up plan: No follow-ups on file.

## 2023-01-26 ENCOUNTER — Other Ambulatory Visit: Payer: Self-pay

## 2023-01-26 ENCOUNTER — Ambulatory Visit: Payer: Medicaid Other | Admitting: Nurse Practitioner

## 2023-01-26 DIAGNOSIS — J4520 Mild intermittent asthma, uncomplicated: Secondary | ICD-10-CM

## 2023-01-26 DIAGNOSIS — B3731 Acute candidiasis of vulva and vagina: Secondary | ICD-10-CM

## 2023-01-26 MED ORDER — FLUCONAZOLE 150 MG PO TABS: 150.0000 mg | ORAL_TABLET | Freq: Once | ORAL | 0 refills | Status: AC

## 2023-02-12 ENCOUNTER — Telehealth: Payer: Self-pay

## 2023-02-12 ENCOUNTER — Ambulatory Visit: Payer: Medicaid Other | Attending: Cardiology

## 2023-02-12 NOTE — Telephone Encounter (Signed)
Incoming staff message:  Danielle Filbert, RN  [place], Danielle Hammond, [place] Notification from ZIO stating the patient montior ordered by Dr. Garen Lah is still pending return.  Doesn't look like she had her echo done either and she is scheduled to see Ryan back on 3/1.  May want to reach out to her to see if she is declining all testing/ if she even wants to follow up on 3/1.  Thanks!  Called patient. Left detailed message on VM - okay per DPR. Making patient aware that zio has not received her monitor and she no showed for echocardiogram. Wanted to know fi you would like to reschedule those appts or if she wanted to cancel. Stated on the VM that she could either call or send a MyChart message letting us know what she would like to do.

## 2023-02-15 NOTE — Progress Notes (Deleted)
Cardiology Office Note:    Date:  02/15/2023   ID:  Danielle Hammond, DOB 09-23-2001, MRN [id number]  PCP:  Bo Merino, [place] [facility] Cardiologist:  Kate Sable, MD { Click to update primary MD,subspecialty MD or APP then REFRESH:1}    Referring MD: Bo Merino, FNP   No chief complaint on file.   History of Present Illness:    Danielle Hammond is a 22 y.o. female with a hx of asthma, anxiety, depression, syncope, hypertension.  She initially establish care with our practice in January 2024 with Dr. Garen Lah, following a recent syncopal episode.  She was bathing her infant in her sink, warm and flushed and passed out for 3 minutes.  She had 2 similar episodes over the next couple of months.  She does not recall any dizziness associated with the syncopal episodes, but noticed that her heart rate was elevated.  She was prescribed labetalol for hypertension in pregnancy however she was not taking it any longer.  A cardiac monitor was arranged, but has not been returned to [facility] yet. An echocardiogram was ordered, however she did not show for the appointment.     Past Medical History:  Diagnosis Date   Acne    Allergic rhinitis    Anxiety    Asthma    Depression    Dysmenorrhea in adolescent    Epistaxis    Preterm labor     Past Surgical History:  Procedure Laterality Date   NO PAST SURGERIES      Current Medications: No outpatient medications have been marked as taking for the 02/16/23 encounter (Appointment) with Rise Mu, PA-C.     Allergies:   Procaine and Procaine hcl   Social History   Socioeconomic History   Marital status: Single    Spouse name: Not on file   Number of children: 1   Years of education: Not on file   Highest education level: Not on file  Occupational History   Not on file  Tobacco Use   Smoking status: Never    Passive exposure: Past   Smokeless tobacco: Never  Vaping Use   Vaping Use: Never  used  Substance and Sexual Activity   Alcohol use: Never   Drug use: Never   Sexual activity: Yes    Birth control/protection: Injection  Other Topics Concern   Not on file  Social History Narrative   Not on file   Social Determinants of Health   Financial Resource Strain: Not on file  Food Insecurity: Not on file  Transportation Needs: Not on file  Physical Activity: Not on file  Stress: Not on file  Social Connections: Not on file     Family History: The patient's ***family history includes Heart Problems in her maternal grandmother. There is no history of Breast cancer or Ovarian cancer.  ROS:   Please see the history of present illness.    *** All other systems reviewed and are negative.  EKGs/Labs/Other Studies Reviewed:    The following studies were reviewed today: ***  EKG:  EKG is *** ordered today.  The ekg ordered today demonstrates ***  Recent Labs: 11/17/2022: ALT 10; BUN 19; Creat 0.73; Hemoglobin 12.8; Platelets 313; Potassium 4.2; Sodium 139; TSH 3.32  Recent Lipid Panel No results found for: "CHOL", "TRIG", "HDL", "CHOLHDL", "VLDL", "LDLCALC", "LDLDIRECT"   Risk Assessment/Calculations:   {Does this patient have ATRIAL FIBRILLATION?:[phone]}  No BP recorded.  {Refresh Note OR Click here  to enter BP  :1}***         Physical Exam:    VS:  There were no vitals taken for this visit.    Wt Readings from Last 3 Encounters:  01/24/23 161 lb (73 kg)  01/04/23 162 lb 9.6 oz (73.8 kg)  12/01/22 161 lb 11.2 oz (73.3 kg)     GEN: *** Well nourished, well developed in no acute distress HEENT: Normal NECK: No JVD; No carotid bruits LYMPHATICS: No lymphadenopathy CARDIAC: ***RRR, no murmurs, rubs, gallops RESPIRATORY:  Clear to auscultation without rales, wheezing or rhonchi  ABDOMEN: Soft, non-tender, non-distended MUSCULOSKELETAL:  No edema; No deformity  SKIN: Warm and dry NEUROLOGIC:  Alert and oriented x 3 PSYCHIATRIC:  Normal affect    ASSESSMENT:    1. Syncope and collapse   2. Hypertension, unspecified type   3. Asthma, unspecified asthma severity, unspecified whether complicated, unspecified whether persistent   4. Anxiety and depression    PLAN:    In order of problems listed above:  ***      {Are you ordering a CV Procedure (e.g. stress test, cath, DCCV, TEE, etc)?   Press F2        :[id number]    Medication Adjustments/Labs and Tests Ordered: Current medicines are reviewed at length with the patient today.  Concerns regarding medicines are outlined above.  No orders of the defined types were placed in this encounter.  No orders of the defined types were placed in this encounter.   There are no Patient Instructions on file for this visit.   Signed, Trudi Ida, NP  02/15/2023 9:50 AM    [place]

## 2023-02-16 ENCOUNTER — Ambulatory Visit: Payer: Medicaid Other | Admitting: Physician Assistant

## 2023-02-26 ENCOUNTER — Telehealth: Payer: Self-pay

## 2023-02-26 NOTE — Telephone Encounter (Signed)
patient does not wish to wear another monitor.  She reports it left a big mark where it was placed and its still there.  Patient would like to keep follow up appt though.   ===View-only below this line=== ----- Message ----- From: Kate Sable, MD Sent: 02/26/2023   1:26 PM EDT To: Janan Ridge, CMA Subject: RE: [place]                                   Mail out new monitor. move out follow-up appointment. ----- Message ----- From: Janan Ridge, CMA Sent: 02/26/2023  10:18 AM EDT To: Kate Sable, MD Subject: Lost [name] marked as lost - Follow up appt 04/16

## 2023-03-30 ENCOUNTER — Ambulatory Visit: Payer: Medicaid Other | Attending: Cardiology

## 2023-03-30 DIAGNOSIS — R55 Syncope and collapse: Secondary | ICD-10-CM

## 2023-03-30 LAB — ECHOCARDIOGRAM COMPLETE
AR max vel: 2.04 cm2
AV Area VTI: 1.9 cm2
AV Area mean vel: 2.06 cm2
AV Mean grad: 4.3 mmHg
AV Peak grad: 8.4 mmHg
Ao pk vel: 1.45 m/s
Area-P 1/2: 4.63 cm2
Calc EF: 60.2 %
S' Lateral: 3 cm
Single Plane A2C EF: 62.6 %
Single Plane A4C EF: 58.5 %

## 2023-04-03 ENCOUNTER — Ambulatory Visit: Payer: Medicaid Other | Admitting: Physician Assistant

## 2023-04-03 NOTE — Progress Notes (Deleted)
Cardiology Office Note    Date:  04/03/2023   ID:  Danielle Hammond, DOB 03/15/2001, MRN [id number]  PCP:  Berniece Salines, FNP  Cardiologist:  Debbe Odea, MD  Electrophysiologist:  None   Chief Complaint: Follow-up  History of Present Illness:   Danielle Hammond is a 22 y.o. female with history of syncope, HTN, and asthma who presents for follow-up of echo.  She was evaluated as a new patient in 12/2022 for syncope.  At that time, she reported approximately 4 months after delivery of her baby she felt warm and flushed with subsequent syncope for approximately 3 minutes.  She woke up on the floor.  She reports having had 2 other episodes over the subsequent next 5 months with the last episode occurring 2 weeks before her cardiology visit while she was standing.  She reported rapid heart rates coinciding with her syncope.  It was noted she had been diagnosed with elevated blood pressure and started on labetalol, though stopped taking this as she did not like taking BP medications.  Blood pressures were in the 130s systolic.  Zio patch was recommended and is pending at this time.  Review of ZIO suite shows monitor was lost in the mail.  Due to contact dermatitis noted with the initial monitor, she preferred to not repeat study.  Echo in 03/2023 showed an EF of 60 to 65%, no regional wall motion abnormalities, normal LV diastolic function parameters, normal RV systolic function and ventricular cavity size, mild mitral regurgitation, and an estimated right atrial pressure of 3 mmHg.  ***   Labs independently reviewed: 11/2022 - BUN 19, serum creatinine 0.73, potassium 4.2, albumin 4.6, AST/ALT normal, Hgb 12.8, PLT 313, TSH normal  Past Medical History:  Diagnosis Date   Acne    Allergic rhinitis    Anxiety    Asthma    Depression    Dysmenorrhea in adolescent    Epistaxis    Preterm labor     Past Surgical History:  Procedure Laterality Date   NO PAST SURGERIES      Current  Medications: No outpatient medications have been marked as taking for the 04/03/23 encounter (Appointment) with Sondra Barges, PA-C.    Allergies:   Procaine and Procaine hcl   Social History   Socioeconomic History   Marital status: Single    Spouse name: Not on file   Number of children: 1   Years of education: Not on file   Highest education level: Not on file  Occupational History   Not on file  Tobacco Use   Smoking status: Never    Passive exposure: Past   Smokeless tobacco: Never  Vaping Use   Vaping Use: Never used  Substance and Sexual Activity   Alcohol use: Never   Drug use: Never   Sexual activity: Yes    Birth control/protection: Injection  Other Topics Concern   Not on file  Social History Narrative   Not on file   Social Determinants of Health   Financial Resource Strain: Not on file  Food Insecurity: Not on file  Transportation Needs: Not on file  Physical Activity: Not on file  Stress: Not on file  Social Connections: Not on file     Family History:  The patient's family history includes Heart Problems in her maternal grandmother. There is no history of Breast cancer or Ovarian cancer.  ROS:   12-point review of systems is negative unless otherwise noted in the HPI.  EKGs/Labs/Other Studies Reviewed:    Studies reviewed were summarized above. The additional studies were reviewed today:  2D echo 03/30/2023: 1. Left ventricular ejection fraction, by estimation, is 60 to 65%. The  left ventricle has normal function. The left ventricle has no regional  wall motion abnormalities. Left ventricular diastolic parameters were  normal.   2. Right ventricular systolic function is normal. The right ventricular  size is normal. Tricuspid regurgitation signal is inadequate for assessing  PA pressure.   3. The mitral valve is normal in structure. Mild mitral valve  regurgitation. No evidence of mitral stenosis.   4. The aortic valve is tricuspid. Aortic  valve regurgitation is not  visualized. No aortic stenosis is present.   5. The inferior vena cava is normal in size with greater than 50%  respiratory variability, suggesting right atrial pressure of 3 mmHg.  __________  Luci Bank patch 12/2022: Pending   EKG:  EKG is ordered today.  The EKG ordered today demonstrates ***  Recent Labs: 11/17/2022: ALT 10; BUN 19; Creat 0.73; Hemoglobin 12.8; Platelets 313; Potassium 4.2; Sodium 139; TSH 3.32  Recent Lipid Panel No results found for: "CHOL", "TRIG", "HDL", "CHOLHDL", "VLDL", "LDLCALC", "LDLDIRECT"  PHYSICAL EXAM:    VS:  There were no vitals taken for this visit.  BMI: There is no height or weight on file to calculate BMI.  Physical Exam  Wt Readings from Last 3 Encounters:  01/24/23 161 lb (73 kg)  01/04/23 162 lb 9.6 oz (73.8 kg)  12/01/22 161 lb 11.2 oz (73.3 kg)     ASSESSMENT & PLAN:   Syncope:  HTN: Blood pressure   {Are you ordering a CV Procedure (e.g. stress test, cath, DCCV, TEE, etc)?   Press F2        :[id number]}     Disposition: F/u with Dr. Azucena Cecil or an APP in ***.   Medication Adjustments/Labs and Tests Ordered: Current medicines are reviewed at length with the patient today.  Concerns regarding medicines are outlined above. Medication changes, Labs and Tests ordered today are summarized above and listed in the Patient Instructions accessible in Encounters.   Signed, Eula Listen, PA-C 04/03/2023 7:53 AM     [facility] [place] [address] [place], [place] [address] [phone]

## 2023-07-18 ENCOUNTER — Encounter: Payer: Self-pay | Admitting: Licensed Practical Nurse

## 2023-08-08 ENCOUNTER — Ambulatory Visit: Payer: Medicaid Other | Admitting: Obstetrics

## 2023-10-03 ENCOUNTER — Encounter: Payer: Self-pay | Admitting: Nurse Practitioner

## 2023-10-03 ENCOUNTER — Other Ambulatory Visit (HOSPITAL_COMMUNITY)
Admission: RE | Admit: 2023-10-03 | Discharge: 2023-10-03 | Disposition: A | Discharge status: Home / Self Care | Payer: Medicaid Other | Source: Ambulatory Visit | Attending: Nurse Practitioner | Admitting: Nurse Practitioner

## 2023-10-03 ENCOUNTER — Other Ambulatory Visit: Payer: Self-pay

## 2023-10-03 ENCOUNTER — Ambulatory Visit: Payer: Medicaid Other | Admitting: Nurse Practitioner

## 2023-10-03 VITALS — BP 118/78 | HR 80 | Temp 97.5°F | Resp 18 | Ht 65.0 in | Wt 148.8 lb

## 2023-10-03 DIAGNOSIS — N939 Abnormal uterine and vaginal bleeding, unspecified: Secondary | ICD-10-CM

## 2023-10-03 LAB — POCT URINE PREGNANCY: Preg Test, Ur: NEGATIVE

## 2023-10-03 NOTE — Progress Notes (Signed)
   BP 118/78   Pulse 80   Temp (!) 97.5 F (36.4 C) (Oral)   Resp 18   Ht 5\' 5"  (1.651 m)   Wt 148 lb 12.8 oz (67.5 kg)   LMP 09/07/2023   SpO2 99%   BMI 24.76 kg/m    Subjective:    Patient ID: Danielle Hammond, female    DOB: 06-16-2001, 22 y.o.   MRN: [id number]  HPI: Danielle Hammond is a 22 y.o. female  Chief Complaint  Patient presents with   Referral    Gyn for abnormal bleeding   Weight Loss    160 lbs was highest   Vaginal bleeding/weight loss: she reports that she has had abnormal vaginal bleeding for the last two months. She reports that she is having vaginal bleeding for about 10 days.  She says that it will switch from heavy to light.  She reports she also has expelled tissue. Patient reports her last normal period 07/2023. Patient reports she has had pelvic pain.  Will get urine dip,  vaginal swab and labs.  patient was established with [facility] but she would like a different GYN.  Requesting referral to [facility]. Referral placed. Will order ultrasound tomorrow after getting lab work. Patient reports she has been losing weight. She says that she was 160 and is now down to 148.  Getting labs. Upreg was negative   Relevant past medical, surgical, family and social history reviewed and updated as indicated. Interim medical history since our last visit reviewed. Allergies and medications reviewed and updated.  Review of Systems  Ten systems reviewed and is negative except as mentioned in HPI       Objective:    BP 118/78   Pulse 80   Temp (!) 97.5 F (36.4 C) (Oral)   Resp 18   Ht 5\' 5"  (1.651 m)   Wt 148 lb 12.8 oz (67.5 kg)   LMP 09/07/2023   SpO2 99%   BMI 24.76 kg/m   Wt Readings from Last 3 Encounters:  10/03/23 148 lb 12.8 oz (67.5 kg)  01/24/23 161 lb (73 kg)  01/04/23 162 lb 9.6 oz (73.8 kg)    Physical Exam  Constitutional: Patient appears well-developed and well-nourished.  No distress.  HEENT: head atraumatic, normocephalic,  pupils equal and reactive to light, neck supple Cardiovascular: Normal rate, regular rhythm and normal heart sounds.  No murmur heard. No BLE edema. Pulmonary/Chest: Effort normal and breath sounds normal. No respiratory distress. Abdominal: Soft.  There is no tenderness. Psychiatric: Patient has a normal mood and affect. behavior is normal. Judgment and thought content normal.   Results for orders placed or performed in visit on 10/03/23  POCT urine pregnancy  Result Value Ref Range   Preg Test, Ur Negative Negative      Assessment & Plan:   Problem List Items Addressed This Visit   None Visit Diagnoses     Vaginal bleeding    -  Primary   getting labs, upreg ( negative),  referral to GYN,  will oder ultrasound after beta quant result.   Relevant Orders   Ambulatory referral to Gynecology   POCT urine pregnancy (Completed)   CBC with Differential/Platelet   COMPLETE METABOLIC PANEL WITH GFR   TSH   hCG, quantitative, pregnancy   Cervicovaginal ancillary only        Follow up plan: Return if symptoms worsen or fail to improve.

## 2023-10-04 ENCOUNTER — Other Ambulatory Visit: Payer: Self-pay | Admitting: Nurse Practitioner

## 2023-10-04 DIAGNOSIS — R102 Pelvic and perineal pain: Secondary | ICD-10-CM

## 2023-10-04 LAB — COMPLETE METABOLIC PANEL WITHOUT GFR
AG Ratio: 2.2 (calc) (ref 1.0–2.5)
ALT: 7 U/L (ref 6–29)
AST: 12 U/L (ref 10–30)
Albumin: 4.8 g/dL (ref 3.6–5.1)
Alkaline phosphatase (APISO): 61 U/L (ref 31–125)
BUN: 17 mg/dL (ref 7–25)
CO2: 27 mmol/L (ref 20–32)
Calcium: 9.4 mg/dL (ref 8.6–10.2)
Chloride: 103 mmol/L (ref 98–110)
Creat: 0.73 mg/dL (ref 0.50–0.96)
Globulin: 2.2 g/dL (ref 1.9–3.7)
Glucose, Bld: 88 mg/dL (ref 65–99)
Potassium: 4.2 mmol/L (ref 3.5–5.3)
Sodium: 139 mmol/L (ref 135–146)
Total Bilirubin: 0.8 mg/dL (ref 0.2–1.2)
Total Protein: 7 g/dL (ref 6.1–8.1)
eGFR: 119 mL/min/1.73m2

## 2023-10-04 LAB — CBC WITH DIFFERENTIAL/PLATELET
Absolute Lymphocytes: 2580 {cells}/uL (ref 850–3900)
Absolute Monocytes: 524 {cells}/uL (ref 200–950)
Basophils Absolute: 69 {cells}/uL (ref 0–200)
Basophils Relative: 0.9 %
Eosinophils Absolute: 339 {cells}/uL (ref 15–500)
Eosinophils Relative: 4.4 %
HCT: 40 % (ref 35.0–45.0)
Hemoglobin: 12.8 g/dL (ref 11.7–15.5)
MCH: 29.2 pg (ref 27.0–33.0)
MCHC: 32 g/dL (ref 32.0–36.0)
MCV: 91.3 fL (ref 80.0–100.0)
MPV: 12.6 fL — ABNORMAL HIGH (ref 7.5–12.5)
Monocytes Relative: 6.8 %
Neutro Abs: 4189 {cells}/uL (ref 1500–7800)
Neutrophils Relative %: 54.4 %
Platelets: 273 10*3/uL (ref 140–400)
RBC: 4.38 10*6/uL (ref 3.80–5.10)
RDW: 13 % (ref 11.0–15.0)
Total Lymphocyte: 33.5 %
WBC: 7.7 10*3/uL (ref 3.8–10.8)

## 2023-10-04 LAB — TSH: TSH: 2.58 m[IU]/L

## 2023-10-04 LAB — HCG, QUANTITATIVE, PREGNANCY: HCG, Total, QN: <5 m[IU]/mL

## 2023-10-05 ENCOUNTER — Other Ambulatory Visit: Payer: Self-pay | Admitting: Nurse Practitioner

## 2023-10-05 DIAGNOSIS — B9689 Other specified bacterial agents as the cause of diseases classified elsewhere: Secondary | ICD-10-CM

## 2023-10-05 LAB — CERVICOVAGINAL ANCILLARY ONLY
Bacterial Vaginitis (gardnerella): POSITIVE — AB
Candida Glabrata: NEGATIVE
Candida Vaginitis: NEGATIVE
Chlamydia: NEGATIVE
Comment: NEGATIVE
Comment: NEGATIVE
Comment: NEGATIVE
Comment: NEGATIVE
Comment: NEGATIVE
Comment: NORMAL
Neisseria Gonorrhea: NEGATIVE
Trichomonas: NEGATIVE

## 2023-10-05 MED ORDER — METRONIDAZOLE 500 MG PO TABS: 500.0000 mg | ORAL_TABLET | Freq: Two times a day (BID) | ORAL | 0 refills | Status: AC

## 2023-10-10 ENCOUNTER — Ambulatory Visit: Payer: Medicaid Other | Admitting: Nurse Practitioner

## 2023-10-11 ENCOUNTER — Ambulatory Visit
Admission: RE | Admit: 2023-10-11 | Discharge: 2023-10-11 | Disposition: A | Discharge status: Home / Self Care | Payer: Medicaid Other | Source: Ambulatory Visit | Attending: Nurse Practitioner | Admitting: Nurse Practitioner

## 2023-10-11 DIAGNOSIS — R102 Pelvic and perineal pain: Secondary | ICD-10-CM | POA: Insufficient documentation

## 2023-11-16 ENCOUNTER — Encounter: Payer: Self-pay | Admitting: Nurse Practitioner

## 2024-11-17 ENCOUNTER — Other Ambulatory Visit: Payer: Self-pay

## 2024-11-17 ENCOUNTER — Emergency Department (HOSPITAL_COMMUNITY)
Admission: EM | Admit: 2024-11-17 | Discharge: 2024-11-17 | Disposition: A | Discharge status: Home / Self Care | Attending: Emergency Medicine | Admitting: Emergency Medicine

## 2024-11-17 ENCOUNTER — Emergency Department (HOSPITAL_COMMUNITY)

## 2024-11-17 DIAGNOSIS — R21 Rash and other nonspecific skin eruption: Secondary | ICD-10-CM | POA: Insufficient documentation

## 2024-11-17 DIAGNOSIS — Y92410 Unspecified street and highway as the place of occurrence of the external cause: Secondary | ICD-10-CM | POA: Diagnosis not present

## 2024-11-17 DIAGNOSIS — J45909 Unspecified asthma, uncomplicated: Secondary | ICD-10-CM | POA: Insufficient documentation

## 2024-11-17 DIAGNOSIS — S0990XA Unspecified injury of head, initial encounter: Secondary | ICD-10-CM | POA: Diagnosis present
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
## 2024-11-17 NOTE — Discharge Instructions (Signed)
 Your CT scan did not show any signs of serious head injury.  You can take over-the-counter Tylenol  and ibuprofen  as needed for your headache.  Symptoms should resolve over the next week or so.  Follow-up with your primary care doctor to be rechecked if you continue to have persistent symptoms
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
